# Patient Record
Sex: Male | Born: 1950
Health system: Southern US, Community
[De-identification: ages and names within clinical notes are randomized; demographics above are authoritative.]

## PROBLEM LIST (undated history)

## (undated) DIAGNOSIS — C801 Malignant (primary) neoplasm, unspecified: Secondary | ICD-10-CM

## (undated) HISTORY — PX: TONSILLECTOMY: SUR1361

## (undated) HISTORY — PX: PROSTATECTOMY: SHX69

## (undated) HISTORY — PX: KNEE SURGERY: SHX244

---
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
## 1996-06-08 DIAGNOSIS — C801 Malignant (primary) neoplasm, unspecified: Secondary | ICD-10-CM

## 1996-06-08 HISTORY — DX: Malignant (primary) neoplasm, unspecified: C80.1

## 1999-07-07 ENCOUNTER — Encounter: Admission: RE | Admit: 1999-07-07 | Discharge: 1999-07-07 | Payer: Self-pay | Admitting: Internal Medicine

## 1999-07-07 ENCOUNTER — Encounter: Payer: Self-pay | Admitting: Internal Medicine

## 2000-03-12 ENCOUNTER — Ambulatory Visit (HOSPITAL_BASED_OUTPATIENT_CLINIC_OR_DEPARTMENT_OTHER): Admission: RE | Admit: 2000-03-12 | Discharge: 2000-03-12 | Payer: Self-pay | Admitting: Otolaryngology

## 2001-09-09 ENCOUNTER — Encounter (INDEPENDENT_AMBULATORY_CARE_PROVIDER_SITE_OTHER): Payer: Self-pay | Admitting: Specialist

## 2001-09-09 ENCOUNTER — Ambulatory Visit (HOSPITAL_COMMUNITY): Admission: RE | Admit: 2001-09-09 | Discharge: 2001-09-09 | Payer: Self-pay | Admitting: Gastroenterology

## 2004-08-22 ENCOUNTER — Encounter: Admission: RE | Admit: 2004-08-22 | Discharge: 2004-08-22 | Payer: Self-pay | Admitting: Internal Medicine

## 2007-02-22 ENCOUNTER — Encounter: Admission: RE | Admit: 2007-02-22 | Discharge: 2007-02-22 | Payer: Self-pay | Admitting: Internal Medicine

## 2010-11-10 ENCOUNTER — Other Ambulatory Visit: Payer: Self-pay | Admitting: Gastroenterology

## 2015-08-30 DIAGNOSIS — L718 Other rosacea: Secondary | ICD-10-CM | POA: Diagnosis not present

## 2015-08-30 DIAGNOSIS — D1801 Hemangioma of skin and subcutaneous tissue: Secondary | ICD-10-CM | POA: Diagnosis not present

## 2015-08-30 DIAGNOSIS — D485 Neoplasm of uncertain behavior of skin: Secondary | ICD-10-CM | POA: Diagnosis not present

## 2015-08-30 DIAGNOSIS — L300 Nummular dermatitis: Secondary | ICD-10-CM | POA: Diagnosis not present

## 2015-08-30 DIAGNOSIS — L218 Other seborrheic dermatitis: Secondary | ICD-10-CM | POA: Diagnosis not present

## 2015-08-30 DIAGNOSIS — L570 Actinic keratosis: Secondary | ICD-10-CM | POA: Diagnosis not present

## 2015-09-06 DIAGNOSIS — G4733 Obstructive sleep apnea (adult) (pediatric): Secondary | ICD-10-CM | POA: Diagnosis not present

## 2015-10-07 DIAGNOSIS — G4733 Obstructive sleep apnea (adult) (pediatric): Secondary | ICD-10-CM | POA: Diagnosis not present

## 2015-11-01 DIAGNOSIS — M6530 Trigger finger, unspecified finger: Secondary | ICD-10-CM | POA: Diagnosis not present

## 2015-11-01 DIAGNOSIS — Z1389 Encounter for screening for other disorder: Secondary | ICD-10-CM | POA: Diagnosis not present

## 2015-11-01 DIAGNOSIS — M109 Gout, unspecified: Secondary | ICD-10-CM | POA: Diagnosis not present

## 2015-11-01 DIAGNOSIS — Z0000 Encounter for general adult medical examination without abnormal findings: Secondary | ICD-10-CM | POA: Diagnosis not present

## 2015-11-01 DIAGNOSIS — E7800 Pure hypercholesterolemia, unspecified: Secondary | ICD-10-CM | POA: Diagnosis not present

## 2015-11-01 DIAGNOSIS — Z23 Encounter for immunization: Secondary | ICD-10-CM | POA: Diagnosis not present

## 2015-11-01 DIAGNOSIS — M7130 Other bursal cyst, unspecified site: Secondary | ICD-10-CM | POA: Diagnosis not present

## 2015-11-01 DIAGNOSIS — Z125 Encounter for screening for malignant neoplasm of prostate: Secondary | ICD-10-CM | POA: Diagnosis not present

## 2015-11-01 DIAGNOSIS — Z79899 Other long term (current) drug therapy: Secondary | ICD-10-CM | POA: Diagnosis not present

## 2015-11-01 DIAGNOSIS — G4733 Obstructive sleep apnea (adult) (pediatric): Secondary | ICD-10-CM | POA: Diagnosis not present

## 2015-11-01 DIAGNOSIS — N183 Chronic kidney disease, stage 3 (moderate): Secondary | ICD-10-CM | POA: Diagnosis not present

## 2015-11-07 DIAGNOSIS — G4733 Obstructive sleep apnea (adult) (pediatric): Secondary | ICD-10-CM | POA: Diagnosis not present

## 2015-12-09 DIAGNOSIS — L570 Actinic keratosis: Secondary | ICD-10-CM | POA: Diagnosis not present

## 2015-12-09 DIAGNOSIS — B009 Herpesviral infection, unspecified: Secondary | ICD-10-CM | POA: Diagnosis not present

## 2015-12-09 DIAGNOSIS — G4733 Obstructive sleep apnea (adult) (pediatric): Secondary | ICD-10-CM | POA: Diagnosis not present

## 2015-12-09 DIAGNOSIS — L259 Unspecified contact dermatitis, unspecified cause: Secondary | ICD-10-CM | POA: Diagnosis not present

## 2015-12-11 ENCOUNTER — Other Ambulatory Visit: Payer: Self-pay | Admitting: Nurse Practitioner

## 2015-12-11 ENCOUNTER — Ambulatory Visit
Admission: RE | Admit: 2015-12-11 | Discharge: 2015-12-11 | Disposition: A | Discharge status: Home / Self Care | Payer: PPO | Source: Ambulatory Visit | Attending: Nurse Practitioner | Admitting: Nurse Practitioner

## 2015-12-11 DIAGNOSIS — M25561 Pain in right knee: Secondary | ICD-10-CM | POA: Diagnosis not present

## 2015-12-11 DIAGNOSIS — M179 Osteoarthritis of knee, unspecified: Secondary | ICD-10-CM | POA: Diagnosis not present

## 2015-12-11 DIAGNOSIS — M25461 Effusion, right knee: Secondary | ICD-10-CM | POA: Diagnosis not present

## 2015-12-16 DIAGNOSIS — M1711 Unilateral primary osteoarthritis, right knee: Secondary | ICD-10-CM | POA: Diagnosis not present

## 2016-01-09 DIAGNOSIS — G4733 Obstructive sleep apnea (adult) (pediatric): Secondary | ICD-10-CM | POA: Diagnosis not present

## 2016-02-11 DIAGNOSIS — G4733 Obstructive sleep apnea (adult) (pediatric): Secondary | ICD-10-CM | POA: Diagnosis not present

## 2016-02-25 DIAGNOSIS — S0031XA Abrasion of nose, initial encounter: Secondary | ICD-10-CM | POA: Diagnosis not present

## 2016-02-25 DIAGNOSIS — S0081XA Abrasion of other part of head, initial encounter: Secondary | ICD-10-CM | POA: Diagnosis not present

## 2016-03-06 DIAGNOSIS — L259 Unspecified contact dermatitis, unspecified cause: Secondary | ICD-10-CM | POA: Diagnosis not present

## 2016-03-17 DIAGNOSIS — G4733 Obstructive sleep apnea (adult) (pediatric): Secondary | ICD-10-CM | POA: Diagnosis not present

## 2016-04-09 DIAGNOSIS — M109 Gout, unspecified: Secondary | ICD-10-CM | POA: Diagnosis not present

## 2016-04-09 DIAGNOSIS — Z23 Encounter for immunization: Secondary | ICD-10-CM | POA: Diagnosis not present

## 2016-04-17 DIAGNOSIS — G4733 Obstructive sleep apnea (adult) (pediatric): Secondary | ICD-10-CM | POA: Diagnosis not present

## 2016-05-08 DIAGNOSIS — M109 Gout, unspecified: Secondary | ICD-10-CM | POA: Diagnosis not present

## 2016-05-20 DIAGNOSIS — G4733 Obstructive sleep apnea (adult) (pediatric): Secondary | ICD-10-CM | POA: Diagnosis not present

## 2016-06-22 DIAGNOSIS — G4733 Obstructive sleep apnea (adult) (pediatric): Secondary | ICD-10-CM | POA: Diagnosis not present

## 2016-07-23 DIAGNOSIS — G4733 Obstructive sleep apnea (adult) (pediatric): Secondary | ICD-10-CM | POA: Diagnosis not present

## 2016-07-30 DIAGNOSIS — M25572 Pain in left ankle and joints of left foot: Secondary | ICD-10-CM | POA: Diagnosis not present

## 2016-08-20 IMAGING — CR DG KNEE COMPLETE 4+V*R*
1 series · 1 of 1 positions shown · non-contrast
Comparison: None.

CLINICAL DATA: Right posterior knee pain and swelling for 2-3
weeks. No known injury.

EXAM:
RIGHT KNEE - COMPLETE 4+ VIEW

[view not recorded]
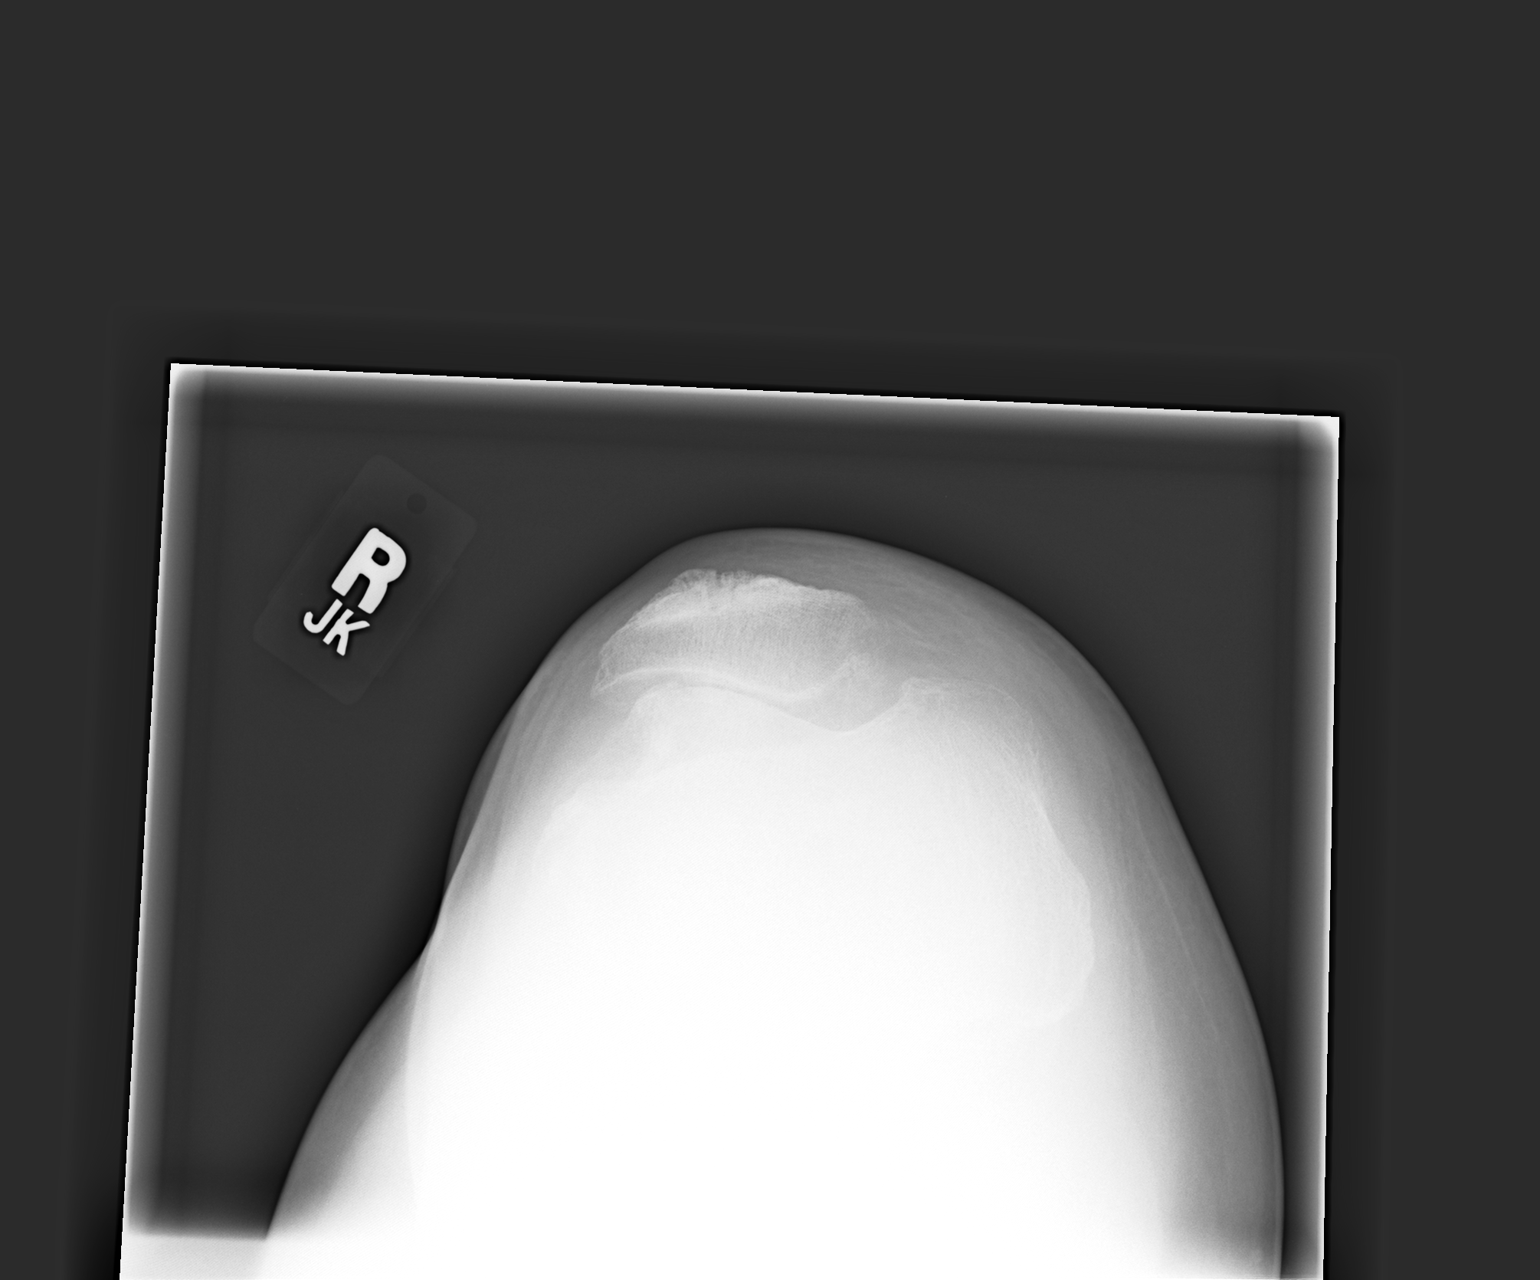

[1 of 1 positions shown; findings below may reference images not displayed]

FINDINGS: Moderate degenerative changes in the right knee with joint space
narrowing and spurring, most pronounced in the patellofemoral
compartment. Small joint effusion. Rounded calcified structure noted
posterior to the right knee which may reflect intra-articular loose
body. Vascular calcifications noted. No fracture, subluxation or
dislocation.
IMPRESSION: Moderate degenerative changes. No acute findings. Small joint
effusion.

## 2016-11-13 DIAGNOSIS — L718 Other rosacea: Secondary | ICD-10-CM | POA: Diagnosis not present

## 2016-11-13 DIAGNOSIS — L570 Actinic keratosis: Secondary | ICD-10-CM | POA: Diagnosis not present

## 2016-11-13 DIAGNOSIS — D225 Melanocytic nevi of trunk: Secondary | ICD-10-CM | POA: Diagnosis not present

## 2016-11-17 DIAGNOSIS — E669 Obesity, unspecified: Secondary | ICD-10-CM | POA: Diagnosis not present

## 2016-11-17 DIAGNOSIS — Z8546 Personal history of malignant neoplasm of prostate: Secondary | ICD-10-CM | POA: Diagnosis not present

## 2016-11-17 DIAGNOSIS — D369 Benign neoplasm, unspecified site: Secondary | ICD-10-CM | POA: Diagnosis not present

## 2016-11-17 DIAGNOSIS — Z79899 Other long term (current) drug therapy: Secondary | ICD-10-CM | POA: Diagnosis not present

## 2016-11-17 DIAGNOSIS — G4733 Obstructive sleep apnea (adult) (pediatric): Secondary | ICD-10-CM | POA: Diagnosis not present

## 2016-11-17 DIAGNOSIS — E7800 Pure hypercholesterolemia, unspecified: Secondary | ICD-10-CM | POA: Diagnosis not present

## 2016-11-17 DIAGNOSIS — M109 Gout, unspecified: Secondary | ICD-10-CM | POA: Diagnosis not present

## 2016-11-17 DIAGNOSIS — N183 Chronic kidney disease, stage 3 (moderate): Secondary | ICD-10-CM | POA: Diagnosis not present

## 2016-11-17 DIAGNOSIS — Z125 Encounter for screening for malignant neoplasm of prostate: Secondary | ICD-10-CM | POA: Diagnosis not present

## 2016-11-17 DIAGNOSIS — J309 Allergic rhinitis, unspecified: Secondary | ICD-10-CM | POA: Diagnosis not present

## 2016-11-17 DIAGNOSIS — Z0000 Encounter for general adult medical examination without abnormal findings: Secondary | ICD-10-CM | POA: Diagnosis not present

## 2016-11-17 DIAGNOSIS — Z1389 Encounter for screening for other disorder: Secondary | ICD-10-CM | POA: Diagnosis not present

## 2016-11-17 DIAGNOSIS — Z6835 Body mass index (BMI) 35.0-35.9, adult: Secondary | ICD-10-CM | POA: Diagnosis not present

## 2016-12-17 DIAGNOSIS — K5730 Diverticulosis of large intestine without perforation or abscess without bleeding: Secondary | ICD-10-CM | POA: Diagnosis not present

## 2016-12-17 DIAGNOSIS — D126 Benign neoplasm of colon, unspecified: Secondary | ICD-10-CM | POA: Diagnosis not present

## 2016-12-17 DIAGNOSIS — K621 Rectal polyp: Secondary | ICD-10-CM | POA: Diagnosis not present

## 2016-12-17 DIAGNOSIS — K648 Other hemorrhoids: Secondary | ICD-10-CM | POA: Diagnosis not present

## 2016-12-21 DIAGNOSIS — D126 Benign neoplasm of colon, unspecified: Secondary | ICD-10-CM | POA: Diagnosis not present

## 2016-12-21 DIAGNOSIS — K621 Rectal polyp: Secondary | ICD-10-CM | POA: Diagnosis not present

## 2017-01-08 DIAGNOSIS — G4733 Obstructive sleep apnea (adult) (pediatric): Secondary | ICD-10-CM | POA: Diagnosis not present

## 2017-03-17 DIAGNOSIS — Z23 Encounter for immunization: Secondary | ICD-10-CM | POA: Diagnosis not present

## 2017-03-17 DIAGNOSIS — I10 Essential (primary) hypertension: Secondary | ICD-10-CM | POA: Diagnosis not present

## 2017-06-18 DIAGNOSIS — I10 Essential (primary) hypertension: Secondary | ICD-10-CM | POA: Diagnosis not present

## 2017-06-18 DIAGNOSIS — E669 Obesity, unspecified: Secondary | ICD-10-CM | POA: Diagnosis not present

## 2017-06-18 DIAGNOSIS — Z6834 Body mass index (BMI) 34.0-34.9, adult: Secondary | ICD-10-CM | POA: Diagnosis not present

## 2017-09-15 DIAGNOSIS — G4733 Obstructive sleep apnea (adult) (pediatric): Secondary | ICD-10-CM | POA: Diagnosis not present

## 2017-09-17 DIAGNOSIS — J309 Allergic rhinitis, unspecified: Secondary | ICD-10-CM | POA: Diagnosis not present

## 2017-09-17 DIAGNOSIS — I10 Essential (primary) hypertension: Secondary | ICD-10-CM | POA: Diagnosis not present

## 2017-10-05 DIAGNOSIS — H0100B Unspecified blepharitis left eye, upper and lower eyelids: Secondary | ICD-10-CM | POA: Diagnosis not present

## 2017-10-08 DIAGNOSIS — G4733 Obstructive sleep apnea (adult) (pediatric): Secondary | ICD-10-CM | POA: Diagnosis not present

## 2017-11-12 DIAGNOSIS — L218 Other seborrheic dermatitis: Secondary | ICD-10-CM | POA: Diagnosis not present

## 2017-11-12 DIAGNOSIS — D485 Neoplasm of uncertain behavior of skin: Secondary | ICD-10-CM | POA: Diagnosis not present

## 2017-11-12 DIAGNOSIS — B351 Tinea unguium: Secondary | ICD-10-CM | POA: Diagnosis not present

## 2017-11-12 DIAGNOSIS — M1712 Unilateral primary osteoarthritis, left knee: Secondary | ICD-10-CM | POA: Diagnosis not present

## 2017-11-19 DIAGNOSIS — M109 Gout, unspecified: Secondary | ICD-10-CM | POA: Diagnosis not present

## 2017-11-19 DIAGNOSIS — Z1389 Encounter for screening for other disorder: Secondary | ICD-10-CM | POA: Diagnosis not present

## 2017-11-19 DIAGNOSIS — E7800 Pure hypercholesterolemia, unspecified: Secondary | ICD-10-CM | POA: Diagnosis not present

## 2017-11-19 DIAGNOSIS — Z125 Encounter for screening for malignant neoplasm of prostate: Secondary | ICD-10-CM | POA: Diagnosis not present

## 2017-11-19 DIAGNOSIS — Z8546 Personal history of malignant neoplasm of prostate: Secondary | ICD-10-CM | POA: Diagnosis not present

## 2017-11-19 DIAGNOSIS — Z79899 Other long term (current) drug therapy: Secondary | ICD-10-CM | POA: Diagnosis not present

## 2017-11-19 DIAGNOSIS — G4733 Obstructive sleep apnea (adult) (pediatric): Secondary | ICD-10-CM | POA: Diagnosis not present

## 2017-11-19 DIAGNOSIS — M25562 Pain in left knee: Secondary | ICD-10-CM | POA: Diagnosis not present

## 2017-11-19 DIAGNOSIS — Z0000 Encounter for general adult medical examination without abnormal findings: Secondary | ICD-10-CM | POA: Diagnosis not present

## 2017-11-19 DIAGNOSIS — I10 Essential (primary) hypertension: Secondary | ICD-10-CM | POA: Diagnosis not present

## 2018-02-01 DIAGNOSIS — D485 Neoplasm of uncertain behavior of skin: Secondary | ICD-10-CM | POA: Diagnosis not present

## 2018-02-01 DIAGNOSIS — L570 Actinic keratosis: Secondary | ICD-10-CM | POA: Diagnosis not present

## 2018-02-01 DIAGNOSIS — L738 Other specified follicular disorders: Secondary | ICD-10-CM | POA: Diagnosis not present

## 2018-03-24 DIAGNOSIS — J45901 Unspecified asthma with (acute) exacerbation: Secondary | ICD-10-CM | POA: Diagnosis not present

## 2018-05-24 DIAGNOSIS — I10 Essential (primary) hypertension: Secondary | ICD-10-CM | POA: Diagnosis not present

## 2018-05-24 DIAGNOSIS — Z6835 Body mass index (BMI) 35.0-35.9, adult: Secondary | ICD-10-CM | POA: Diagnosis not present

## 2018-05-24 DIAGNOSIS — G4733 Obstructive sleep apnea (adult) (pediatric): Secondary | ICD-10-CM | POA: Diagnosis not present

## 2018-05-24 DIAGNOSIS — E7800 Pure hypercholesterolemia, unspecified: Secondary | ICD-10-CM | POA: Diagnosis not present

## 2018-05-24 DIAGNOSIS — Z79899 Other long term (current) drug therapy: Secondary | ICD-10-CM | POA: Diagnosis not present

## 2018-05-25 DIAGNOSIS — G4733 Obstructive sleep apnea (adult) (pediatric): Secondary | ICD-10-CM | POA: Diagnosis not present

## 2018-07-08 DIAGNOSIS — Z8546 Personal history of malignant neoplasm of prostate: Secondary | ICD-10-CM | POA: Diagnosis not present

## 2018-07-08 DIAGNOSIS — I10 Essential (primary) hypertension: Secondary | ICD-10-CM | POA: Diagnosis not present

## 2018-07-28 DIAGNOSIS — I10 Essential (primary) hypertension: Secondary | ICD-10-CM | POA: Diagnosis not present

## 2018-07-28 DIAGNOSIS — Z8546 Personal history of malignant neoplasm of prostate: Secondary | ICD-10-CM | POA: Diagnosis not present

## 2018-09-02 DIAGNOSIS — G4733 Obstructive sleep apnea (adult) (pediatric): Secondary | ICD-10-CM | POA: Diagnosis not present

## 2018-09-14 DIAGNOSIS — G4733 Obstructive sleep apnea (adult) (pediatric): Secondary | ICD-10-CM | POA: Diagnosis not present

## 2018-09-14 DIAGNOSIS — R531 Weakness: Secondary | ICD-10-CM | POA: Diagnosis not present

## 2018-09-14 DIAGNOSIS — E039 Hypothyroidism, unspecified: Secondary | ICD-10-CM | POA: Diagnosis not present

## 2018-10-19 DIAGNOSIS — I10 Essential (primary) hypertension: Secondary | ICD-10-CM | POA: Diagnosis not present

## 2018-10-19 DIAGNOSIS — Z8546 Personal history of malignant neoplasm of prostate: Secondary | ICD-10-CM | POA: Diagnosis not present

## 2018-10-28 DIAGNOSIS — G4733 Obstructive sleep apnea (adult) (pediatric): Secondary | ICD-10-CM | POA: Diagnosis not present

## 2018-11-28 DIAGNOSIS — G4733 Obstructive sleep apnea (adult) (pediatric): Secondary | ICD-10-CM | POA: Diagnosis not present

## 2018-12-05 DIAGNOSIS — Z1389 Encounter for screening for other disorder: Secondary | ICD-10-CM | POA: Diagnosis not present

## 2018-12-05 DIAGNOSIS — Z79899 Other long term (current) drug therapy: Secondary | ICD-10-CM | POA: Diagnosis not present

## 2018-12-05 DIAGNOSIS — I10 Essential (primary) hypertension: Secondary | ICD-10-CM | POA: Diagnosis not present

## 2018-12-05 DIAGNOSIS — G4733 Obstructive sleep apnea (adult) (pediatric): Secondary | ICD-10-CM | POA: Diagnosis not present

## 2018-12-05 DIAGNOSIS — H01009 Unspecified blepharitis unspecified eye, unspecified eyelid: Secondary | ICD-10-CM | POA: Diagnosis not present

## 2018-12-05 DIAGNOSIS — Z0000 Encounter for general adult medical examination without abnormal findings: Secondary | ICD-10-CM | POA: Diagnosis not present

## 2018-12-22 DIAGNOSIS — B0050 Herpesviral ocular disease, unspecified: Secondary | ICD-10-CM | POA: Diagnosis not present

## 2018-12-26 DIAGNOSIS — B0050 Herpesviral ocular disease, unspecified: Secondary | ICD-10-CM | POA: Diagnosis not present

## 2018-12-28 DIAGNOSIS — G4733 Obstructive sleep apnea (adult) (pediatric): Secondary | ICD-10-CM | POA: Diagnosis not present

## 2019-01-04 DIAGNOSIS — Z8546 Personal history of malignant neoplasm of prostate: Secondary | ICD-10-CM | POA: Diagnosis not present

## 2019-01-04 DIAGNOSIS — I10 Essential (primary) hypertension: Secondary | ICD-10-CM | POA: Diagnosis not present

## 2019-01-05 DIAGNOSIS — B0050 Herpesviral ocular disease, unspecified: Secondary | ICD-10-CM | POA: Diagnosis not present

## 2019-01-11 DIAGNOSIS — B0050 Herpesviral ocular disease, unspecified: Secondary | ICD-10-CM | POA: Diagnosis not present

## 2019-01-11 DIAGNOSIS — G4733 Obstructive sleep apnea (adult) (pediatric): Secondary | ICD-10-CM | POA: Diagnosis not present

## 2019-01-11 DIAGNOSIS — H0102A Squamous blepharitis right eye, upper and lower eyelids: Secondary | ICD-10-CM | POA: Diagnosis not present

## 2019-01-11 DIAGNOSIS — H0102B Squamous blepharitis left eye, upper and lower eyelids: Secondary | ICD-10-CM | POA: Diagnosis not present

## 2019-01-12 DIAGNOSIS — I10 Essential (primary) hypertension: Secondary | ICD-10-CM | POA: Diagnosis not present

## 2019-01-12 DIAGNOSIS — Z8546 Personal history of malignant neoplasm of prostate: Secondary | ICD-10-CM | POA: Diagnosis not present

## 2019-01-28 DIAGNOSIS — G4733 Obstructive sleep apnea (adult) (pediatric): Secondary | ICD-10-CM | POA: Diagnosis not present

## 2019-02-08 DIAGNOSIS — R05 Cough: Secondary | ICD-10-CM | POA: Diagnosis not present

## 2019-02-08 DIAGNOSIS — I10 Essential (primary) hypertension: Secondary | ICD-10-CM | POA: Diagnosis not present

## 2019-02-08 DIAGNOSIS — Z0000 Encounter for general adult medical examination without abnormal findings: Secondary | ICD-10-CM | POA: Diagnosis not present

## 2019-02-09 DIAGNOSIS — Z125 Encounter for screening for malignant neoplasm of prostate: Secondary | ICD-10-CM | POA: Diagnosis not present

## 2019-02-09 DIAGNOSIS — I10 Essential (primary) hypertension: Secondary | ICD-10-CM | POA: Diagnosis not present

## 2019-02-09 DIAGNOSIS — B0050 Herpesviral ocular disease, unspecified: Secondary | ICD-10-CM | POA: Diagnosis not present

## 2019-02-09 DIAGNOSIS — H0102A Squamous blepharitis right eye, upper and lower eyelids: Secondary | ICD-10-CM | POA: Diagnosis not present

## 2019-02-09 DIAGNOSIS — H0102B Squamous blepharitis left eye, upper and lower eyelids: Secondary | ICD-10-CM | POA: Diagnosis not present

## 2019-02-12 ENCOUNTER — Other Ambulatory Visit: Payer: Self-pay

## 2019-02-12 ENCOUNTER — Inpatient Hospital Stay (HOSPITAL_COMMUNITY)
Admission: EM | Admit: 2019-02-12 | Discharge: 2019-02-15 | DRG: 176 | Disposition: A | Discharge status: Home / Self Care | Payer: PPO | Attending: Internal Medicine | Admitting: Internal Medicine

## 2019-02-12 ENCOUNTER — Emergency Department (HOSPITAL_COMMUNITY): Payer: PPO

## 2019-02-12 ENCOUNTER — Encounter (HOSPITAL_COMMUNITY): Payer: Self-pay

## 2019-02-12 DIAGNOSIS — I2609 Other pulmonary embolism with acute cor pulmonale: Secondary | ICD-10-CM | POA: Diagnosis not present

## 2019-02-12 DIAGNOSIS — I2602 Saddle embolus of pulmonary artery with acute cor pulmonale: Secondary | ICD-10-CM | POA: Diagnosis not present

## 2019-02-12 DIAGNOSIS — D696 Thrombocytopenia, unspecified: Secondary | ICD-10-CM

## 2019-02-12 DIAGNOSIS — Z803 Family history of malignant neoplasm of breast: Secondary | ICD-10-CM | POA: Diagnosis not present

## 2019-02-12 DIAGNOSIS — I82432 Acute embolism and thrombosis of left popliteal vein: Secondary | ICD-10-CM | POA: Diagnosis not present

## 2019-02-12 DIAGNOSIS — N179 Acute kidney failure, unspecified: Secondary | ICD-10-CM | POA: Diagnosis present

## 2019-02-12 DIAGNOSIS — D7589 Other specified diseases of blood and blood-forming organs: Secondary | ICD-10-CM | POA: Diagnosis present

## 2019-02-12 DIAGNOSIS — Z7951 Long term (current) use of inhaled steroids: Secondary | ICD-10-CM

## 2019-02-12 DIAGNOSIS — N200 Calculus of kidney: Secondary | ICD-10-CM | POA: Diagnosis not present

## 2019-02-12 DIAGNOSIS — Z79899 Other long term (current) drug therapy: Secondary | ICD-10-CM

## 2019-02-12 DIAGNOSIS — Z9079 Acquired absence of other genital organ(s): Secondary | ICD-10-CM

## 2019-02-12 DIAGNOSIS — R7989 Other specified abnormal findings of blood chemistry: Secondary | ICD-10-CM | POA: Diagnosis not present

## 2019-02-12 DIAGNOSIS — M109 Gout, unspecified: Secondary | ICD-10-CM | POA: Diagnosis present

## 2019-02-12 DIAGNOSIS — G4733 Obstructive sleep apnea (adult) (pediatric): Secondary | ICD-10-CM | POA: Diagnosis not present

## 2019-02-12 DIAGNOSIS — R079 Chest pain, unspecified: Secondary | ICD-10-CM | POA: Diagnosis not present

## 2019-02-12 DIAGNOSIS — C787 Secondary malignant neoplasm of liver and intrahepatic bile duct: Secondary | ICD-10-CM | POA: Diagnosis present

## 2019-02-12 DIAGNOSIS — Z8546 Personal history of malignant neoplasm of prostate: Secondary | ICD-10-CM

## 2019-02-12 DIAGNOSIS — I10 Essential (primary) hypertension: Secondary | ICD-10-CM | POA: Diagnosis present

## 2019-02-12 DIAGNOSIS — K769 Liver disease, unspecified: Secondary | ICD-10-CM

## 2019-02-12 DIAGNOSIS — C229 Malignant neoplasm of liver, not specified as primary or secondary: Secondary | ICD-10-CM

## 2019-02-12 DIAGNOSIS — K760 Fatty (change of) liver, not elsewhere classified: Secondary | ICD-10-CM | POA: Diagnosis not present

## 2019-02-12 DIAGNOSIS — I2692 Saddle embolus of pulmonary artery without acute cor pulmonale: Secondary | ICD-10-CM | POA: Diagnosis not present

## 2019-02-12 DIAGNOSIS — D6959 Other secondary thrombocytopenia: Secondary | ICD-10-CM | POA: Diagnosis not present

## 2019-02-12 DIAGNOSIS — R0989 Other specified symptoms and signs involving the circulatory and respiratory systems: Secondary | ICD-10-CM | POA: Diagnosis present

## 2019-02-12 DIAGNOSIS — R0602 Shortness of breath: Secondary | ICD-10-CM | POA: Diagnosis not present

## 2019-02-12 DIAGNOSIS — I340 Nonrheumatic mitral (valve) insufficiency: Secondary | ICD-10-CM | POA: Diagnosis not present

## 2019-02-12 DIAGNOSIS — I2699 Other pulmonary embolism without acute cor pulmonale: Secondary | ICD-10-CM | POA: Diagnosis not present

## 2019-02-12 DIAGNOSIS — R739 Hyperglycemia, unspecified: Secondary | ICD-10-CM | POA: Diagnosis present

## 2019-02-12 DIAGNOSIS — Z20828 Contact with and (suspected) exposure to other viral communicable diseases: Secondary | ICD-10-CM | POA: Diagnosis not present

## 2019-02-12 DIAGNOSIS — R160 Hepatomegaly, not elsewhere classified: Secondary | ICD-10-CM | POA: Diagnosis not present

## 2019-02-12 DIAGNOSIS — C801 Malignant (primary) neoplasm, unspecified: Secondary | ICD-10-CM | POA: Diagnosis not present

## 2019-02-12 LAB — BASIC METABOLIC PANEL
Anion gap: 11 (ref 5–15)
BUN: 16 mg/dL (ref 8–23)
CO2: 23 mmol/L (ref 22–32)
Calcium: 9.9 mg/dL (ref 8.9–10.3)
Chloride: 107 mmol/L (ref 98–111)
Creatinine, Ser: 1.32 mg/dL — ABNORMAL HIGH (ref 0.61–1.24)
GFR calc Af Amer: >60 mL/min (ref >60)
GFR calc non Af Amer: 54 mL/min — ABNORMAL LOW (ref >60)
Glucose, Bld: 124 mg/dL — ABNORMAL HIGH (ref 70–99)
Potassium: 4.2 mmol/L (ref 3.5–5.1)
Sodium: 141 mmol/L (ref 135–145)

## 2019-02-12 LAB — CBC
HCT: 48.3 % (ref 39.0–52.0)
Hemoglobin: 16 g/dL (ref 13.0–17.0)
MCH: 35.1 pg — ABNORMAL HIGH (ref 26.0–34.0)
MCHC: 33.1 g/dL (ref 30.0–36.0)
MCV: 105.9 fL — ABNORMAL HIGH (ref 80.0–100.0)
Platelets: 143 10*3/uL — ABNORMAL LOW (ref 150–400)
RBC: 4.56 MIL/uL (ref 4.22–5.81)
RDW: 13.7 % (ref 11.5–15.5)
WBC: 7.6 10*3/uL (ref 4.0–10.5)
nRBC: 0 % (ref 0.0–0.2)

## 2019-02-12 LAB — BRAIN NATRIURETIC PEPTIDE: B Natriuretic Peptide: 75.1 pg/mL (ref 0.0–100.0)

## 2019-02-12 LAB — TROPONIN I (HIGH SENSITIVITY)
Troponin I (High Sensitivity): 439 ng/L (ref <18)
Troponin I (High Sensitivity): 1007 ng/L (ref <18)
Troponin I (High Sensitivity): 925 ng/L (ref <18)

## 2019-02-12 LAB — SARS CORONAVIRUS 2 BY RT PCR (HOSPITAL ORDER, PERFORMED IN ~~LOC~~ HOSPITAL LAB): SARS Coronavirus 2: NEGATIVE

## 2019-02-12 LAB — RETICULOCYTES
Immature Retic Fract: 12.2 % (ref 2.3–15.9)
RBC.: 4.52 MIL/uL (ref 4.22–5.81)
Retic Count, Absolute: 73.2 10*3/uL (ref 19.0–186.0)
Retic Ct Pct: 1.6 % (ref 0.4–3.1)

## 2019-02-12 LAB — D-DIMER, QUANTITATIVE: D-Dimer, Quant: >20 ug/mL-FEU — ABNORMAL HIGH (ref 0.00–0.50)

## 2019-02-12 LAB — IRON AND TIBC
Iron: 74 ug/dL (ref 45–182)
Saturation Ratios: 23 % (ref 17.9–39.5)
TIBC: 316 ug/dL (ref 250–450)
UIBC: 242 ug/dL

## 2019-02-12 LAB — VITAMIN B12: Vitamin B-12: 617 pg/mL (ref 180–914)

## 2019-02-12 LAB — FERRITIN: Ferritin: 1265 ng/mL — ABNORMAL HIGH (ref 24–336)

## 2019-02-12 LAB — HEPARIN LEVEL (UNFRACTIONATED): Heparin Unfractionated: 0.86 IU/mL — ABNORMAL HIGH (ref 0.30–0.70)

## 2019-02-12 MED ORDER — SODIUM CHLORIDE 0.9% FLUSH
3.0000 mL | Freq: Once | INTRAVENOUS | Status: AC
  Administered 2019-02-12: 3 mL via INTRAVENOUS

## 2019-02-12 MED ORDER — TRAZODONE HCL 50 MG PO TABS
50.0000 mg | ORAL_TABLET | Freq: Once | ORAL | Status: AC
  Administered 2019-02-12: 50 mg via ORAL
  Filled 2019-02-12: qty 1

## 2019-02-12 MED ORDER — LORAZEPAM 2 MG/ML IJ SOLN: 2.0000 mg | INTRAMUSCULAR | Status: DC | PRN

## 2019-02-12 MED ORDER — ALBUTEROL SULFATE (2.5 MG/3ML) 0.083% IN NEBU: 2.5000 mg | INHALATION_SOLUTION | RESPIRATORY_TRACT | Status: DC | PRN

## 2019-02-12 MED ORDER — SENNOSIDES-DOCUSATE SODIUM 8.6-50 MG PO TABS
1.0000 | ORAL_TABLET | Freq: Every evening | ORAL | Status: DC | PRN
  Filled 2019-02-12: qty 1

## 2019-02-12 MED ORDER — BISACODYL 10 MG RE SUPP: 10.0000 mg | Freq: Every day | RECTAL | Status: DC | PRN

## 2019-02-12 MED ORDER — SODIUM CHLORIDE 0.9 % IV SOLN
INTRAVENOUS | Status: DC
  Administered 2019-02-12: 17:00:00 via INTRAVENOUS

## 2019-02-12 MED ORDER — HEPARIN (PORCINE) 25000 UT/250ML-% IV SOLN
1500.0000 [IU]/h | INTRAVENOUS | Status: DC
  Administered 2019-02-12: 1650 [IU]/h via INTRAVENOUS
  Administered 2019-02-13: 19:00:00 1500 [IU]/h via INTRAVENOUS
  Filled 2019-02-12 (×3): qty 250

## 2019-02-12 MED ORDER — SODIUM CHLORIDE (PF) 0.9 % IJ SOLN
INTRAMUSCULAR | Status: AC
  Filled 2019-02-12: qty 50

## 2019-02-12 MED ORDER — DOCUSATE SODIUM 100 MG PO CAPS
100.0000 mg | ORAL_CAPSULE | Freq: Two times a day (BID) | ORAL | Status: DC
  Administered 2019-02-13 – 2019-02-15 (×4): 100 mg via ORAL
  Filled 2019-02-12 (×6): qty 1

## 2019-02-12 MED ORDER — ONDANSETRON HCL 4 MG/2ML IJ SOLN: 4.0000 mg | Freq: Four times a day (QID) | INTRAMUSCULAR | Status: DC | PRN

## 2019-02-12 MED ORDER — ACETAMINOPHEN 650 MG RE SUPP: 650.0000 mg | Freq: Four times a day (QID) | RECTAL | Status: DC | PRN

## 2019-02-12 MED ORDER — HEPARIN BOLUS VIA INFUSION
3000.0000 [IU] | Freq: Once | INTRAVENOUS | Status: AC
  Administered 2019-02-12: 3000 [IU] via INTRAVENOUS
  Filled 2019-02-12: qty 3000

## 2019-02-12 MED ORDER — FOLIC ACID 1 MG PO TABS
1.0000 mg | ORAL_TABLET | Freq: Every day | ORAL | Status: DC
  Administered 2019-02-12 – 2019-02-15 (×4): 1 mg via ORAL
  Filled 2019-02-12 (×4): qty 1

## 2019-02-12 MED ORDER — FLEET ENEMA 7-19 GM/118ML RE ENEM: 1.0000 | ENEMA | Freq: Once | RECTAL | Status: DC | PRN

## 2019-02-12 MED ORDER — ADULT MULTIVITAMIN W/MINERALS CH
1.0000 | ORAL_TABLET | Freq: Every day | ORAL | Status: DC
  Administered 2019-02-12 – 2019-02-15 (×4): 1 via ORAL
  Filled 2019-02-12 (×4): qty 1

## 2019-02-12 MED ORDER — ACETAMINOPHEN 325 MG PO TABS
650.0000 mg | ORAL_TABLET | Freq: Four times a day (QID) | ORAL | Status: DC | PRN
  Administered 2019-02-13 – 2019-02-14 (×2): 650 mg via ORAL
  Filled 2019-02-12 (×2): qty 2

## 2019-02-12 MED ORDER — VITAMIN B-1 100 MG PO TABS
100.0000 mg | ORAL_TABLET | Freq: Every day | ORAL | Status: DC
  Administered 2019-02-12 – 2019-02-15 (×4): 100 mg via ORAL
  Filled 2019-02-12 (×4): qty 1

## 2019-02-12 MED ORDER — IOHEXOL 350 MG/ML SOLN
100.0000 mL | Freq: Once | INTRAVENOUS | Status: AC | PRN
  Administered 2019-02-12: 100 mL via INTRAVENOUS

## 2019-02-12 MED ORDER — ONDANSETRON HCL 4 MG PO TABS: 4.0000 mg | ORAL_TABLET | Freq: Four times a day (QID) | ORAL | Status: DC | PRN

## 2019-02-12 NOTE — Progress Notes (Signed)
ANTICOAGULATION CONSULT NOTE  Pharmacy Consult for heparin Indication: r/o PE  No Known Allergies  Patient Measurements: Height: 6' (182.9 cm) Weight: 242 lb 15.2 oz (110.2 kg) IBW/kg (Calculated) : 77.6 Heparin Dosing Weight: 102.3 kg  Vital Signs: Temp: 99.4 F (37.4 C) (09/06 2018) Temp Source: Oral (09/06 2018) BP: 138/89 (09/06 2018) Pulse Rate: 123 (09/06 2018)  Labs: Recent Labs    02/12/19 1037 02/12/19 1304 02/12/19 1905 02/12/19 2221  HGB 16.0  --   --   --   HCT 48.3  --   --   --   PLT 143*  --   --   --   HEPARINUNFRC  --   --   --  0.86*  CREATININE 1.32*  --   --   --   TROPONINIHS 439* 1,007* 925*  --     Estimated Creatinine Clearance: 66.7 mL/min (A) (by C-G formula based on SCr of 1.32 mg/dL (H)).   Medical History: Past Medical History:  Diagnosis Date  . Cancer [facility]) 1998   prostate    Assessment: 68 yo M to start heparin per pharmacy for r/o PE.  No anticoagulants PTA.   SCr 1.32, PLTC low at 143. D dimer > 2. Trop 439.  Initial heparin level 0.86 units/ml  Goal of Therapy:  Heparin level 0.3-0.7 units/ml Monitor platelets by anticoagulation protocol: Yes   Plan:  Decrease heparin to 1500 units/hr Check heparin level and CBC daily Monitor for bleeding complications  Thanks for allowing pharmacy to be a part of this patient's care.  Excell Seltzer, PharmD Clinical Pharmacist

## 2019-02-12 NOTE — Progress Notes (Signed)
Lab advise Trop 925. Troponin trending down, no action taken. Will continue to monitor,

## 2019-02-12 NOTE — ED Notes (Signed)
Date and time results received: 02/12/19 12:04 PM  (use smartphrase ".now" to insert current time)  Test: Troponin Critical Value: 439  Name of Provider Notified: Geiple  Orders Received? Or Actions Taken?: Actions Taken: Notified Arts development officer and [place], [place]

## 2019-02-12 NOTE — ED Provider Notes (Signed)
[place] DEPT Provider Note   CSN: [id number] Arrival date & time: 02/12/19  [id number]     History   Chief Complaint Chief Complaint  Patient presents with  . Chest Pain    HPI Kenneth Lang is a 68 y.o. male.     Patient with history of prostate cancer presents to the emergency department today with complaint of chest congestion.  Patient states that he has had a congested feeling in his chest for the past several days.  He had a tele-visit with his doctor and prescribed a nasal spray which he feels has helped.  Today he feels some shortness of breath and worsening "congestion" in his chest with activity.  He has had sweating with activity.  He does not have distinct chest pain or pressure.  He feels lightheaded at times but has not had any passing out spells.  No nausea or vomiting.  Tachycardic on arrival.  No history of cardiac or lung disease. Patient denies risk factors for pulmonary embolism including: unilateral leg swelling, history of DVT/PE/other blood clots, use of exogenous hormones, recent immobilizations, recent surgery, recent travel (>4hr segment), hemoptysis.       Past Medical History:  Diagnosis Date  . Cancer [facility]) 1998   prostate    There are no active problems to display for this patient.   History reviewed. No pertinent surgical history.      Home Medications    Prior to Admission medications   Not on File    Family History No family history on file.  Social History Social History   Tobacco Use  . Smoking status: Never Smoker  . Smokeless tobacco: Never Used  Substance Use Topics  . Alcohol use: Yes    Comment: 2-4 night  . Drug use: Never     Allergies   Patient has no known allergies.   Review of Systems Review of Systems  Constitutional: Positive for diaphoresis. Negative for fever.  Eyes: Negative for redness.  Respiratory: Positive for cough, chest tightness and shortness of breath.    Cardiovascular: Negative for chest pain, palpitations and leg swelling.  Gastrointestinal: Negative for abdominal pain, nausea and vomiting.  Genitourinary: Negative for dysuria.  Musculoskeletal: Negative for back pain and neck pain.  Skin: Negative for rash.  Neurological: Positive for light-headedness. Negative for syncope.  Psychiatric/Behavioral: The patient is not nervous/anxious.      Physical Exam Updated Vital Signs BP 134/90 (BP Location: Left Arm)   Pulse (!) 124   Temp 97.8 F (36.6 C) (Oral)   Resp 16   Ht 6' (1.829 m)   Wt 114.8 kg   SpO2 95%   BMI 34.31 kg/m   Physical Exam Vitals signs and nursing note reviewed.  Constitutional:      Appearance: He is well-developed. He is not diaphoretic.  HENT:     Head: Normocephalic and atraumatic.     Mouth/Throat:     Mouth: Mucous membranes are not dry.  Eyes:     Conjunctiva/sclera: Conjunctivae normal.  Neck:     Musculoskeletal: Normal range of motion and neck supple. No muscular tenderness.     Vascular: Normal carotid pulses. No carotid bruit or JVD.     Trachea: Trachea normal. No tracheal deviation.  Cardiovascular:     Rate and Rhythm: Regular rhythm. Tachycardia present.     Pulses: No decreased pulses.     Heart sounds: Normal heart sounds, S1 normal and S2 normal. Heart sounds not distant.  No murmur.  Pulmonary:     Effort: Pulmonary effort is normal. Tachypnea present. No respiratory distress.     Breath sounds: Normal breath sounds. No wheezing.  Chest:     Chest wall: No tenderness.  Abdominal:     General: Bowel sounds are normal.     Palpations: Abdomen is soft.     Tenderness: There is no abdominal tenderness. There is no guarding or rebound.  Musculoskeletal:     Right lower leg: He exhibits no tenderness. No edema.     Left lower leg: He exhibits no tenderness. No edema.     Comments: No clinical signs of DVT.  Skin:    General: Skin is warm and dry.     Coloration: Skin is not pale.   Neurological:     Mental Status: He is alert.      ED Treatments / Results  Labs (all labs ordered are listed, but only abnormal results are displayed) Labs Reviewed  BASIC METABOLIC PANEL - Abnormal; Notable for the following components:      Result Value   Glucose, Bld 124 (*)    Creatinine, Ser 1.32 (*)    GFR calc non Af Amer 54 (*)    All other components within normal limits  CBC - Abnormal; Notable for the following components:   MCV 105.9 (*)    MCH 35.1 (*)    Platelets 143 (*)    All other components within normal limits  D-DIMER, QUANTITATIVE (NOT AT [facility]) - Abnormal; Notable for the following components:   D-Dimer, Quant >20.00 (*)    All other components within normal limits  TROPONIN I (HIGH SENSITIVITY) - Abnormal; Notable for the following components:   Troponin I (High Sensitivity) 439 (*)    All other components within normal limits  TROPONIN I (HIGH SENSITIVITY) - Abnormal; Notable for the following components:   Troponin I (High Sensitivity) 1,007 (*)    All other components within normal limits  SARS CORONAVIRUS 2 (HOSPITAL ORDER, [place] [facility])  HEPARIN LEVEL (UNFRACTIONATED)    EKG EKG Interpretation  Date/Time:  Sunday February 12 2019 10:17:57 EDT Ventricular Rate:  122 PR Interval:    QRS Duration: 96 QT Interval:  301 QTC Calculation: 429 R Axis:   79 Text Interpretation:  Sinus tachycardia Abnormal R-wave progression, early transition Baseline wander in lead(s) I III aVR aVL V3 No old tracing to compare Confirmed by Daleen Bo [phone]) on 02/12/2019 11:20:36 AM   Radiology Dg Chest 2 View  Result Date: 02/12/2019 CLINICAL DATA:  Chest pain, shortness of breath, and fatigue for several days. Diaphoretic. Personal history of prostate carcinoma. EXAM: CHEST - 2 VIEW COMPARISON:  11/30/2008 from [place]: The heart size and mediastinal contours are within normal limits. Both lungs are clear. The  visualized skeletal structures are unremarkable. IMPRESSION: Stable exam.  No active cardiopulmonary disease. Electronically Signed   By: Marlaine Hind M.D.   On: 02/12/2019 11:06    Procedures Procedures (including critical care time)  Medications Ordered in ED Medications  heparin ADULT infusion 100 units/mL (25000 units/241mL sodium chloride 0.45%) (1,650 Units/hr Intravenous New Bag/Given 02/12/19 1249)  sodium chloride (PF) 0.9 % injection (has no administration in time range)  sodium chloride flush (NS) 0.9 % injection 3 mL (3 mLs Intravenous Given 02/12/19 1038)  heparin bolus via infusion 3,000 Units (3,000 Units Intravenous Bolus from Bag 02/12/19 1250)  iohexol (OMNIPAQUE) 350 MG/ML injection 100 mL (100 mLs Intravenous  Contrast Given 02/12/19 1315)     Initial Impression / Assessment and Plan / ED Course  I have reviewed the triage vital signs and the nursing notes.  Pertinent labs & imaging results that were available during my care of the patient were reviewed by me and considered in my medical decision making (see chart for details).        Patient seen and examined.  EKG reviewed showing sinus tachycardia.  Work-up initiated.  Patient appears stable, nontoxic at time of exam.  He speaks in full sentences without any issues.  Will need ACS, PE work-up.  Vital signs reviewed and are as follows: BP 134/90 (BP Location: Left Arm)   Pulse (!) 124   Temp 97.8 F (36.6 C) (Oral)   Resp 16   Ht 6' (1.829 m)   Wt 114.8 kg   SpO2 95%   BMI 34.31 kg/m   10:55 AM O2 89-92%. Pt denies SOB at rest. X-ray came to get patient.   11:29 AM CXR reviewed. Bedside US - cardiac function appears adequate, I do not see a large effusion, ? R-sided dilation. Awaiting d-dimer, troponin.     EMERGENCY DEPARTMENT [place] CARDIAC EXAM "Study: Limited Ultrasound of the Heart and Pericardium"  INDICATIONS:Abnormal vital signs and Dyspnea Multiple views of the heart and pericardium were obtained in  real-time with a multi-frequency probe.  PERFORMED [id number] IMAGES ARCHIVED?: Yes LIMITATIONS:  Body habitus VIEWS USED: Subcostal 4 chamber, Parasternal long axis, Parasternal short axis and Apical 4 chamber  INTERPRETATION: Cardiac activity present, Pericardial effusioin absent, Cardiac tamponade absent, Normal contractility and right ventricle dilated.    12:07 PM d-dimer greater than 20, troponin greater than 400.  Heparin ordered.  Patient discussed with Dr. Eulis Foster who will see.  I rechecked the patient twice.  He is comfortable while lying in the room, no chest pain.  12:42 PM patient rechecked.  He is on 2 L nasal cannula and satting 96 to 97%.  He is comfortable.  He is asking for something to drink.  I informed him we need to get the scan back first.  He understands.  1:21 PM Reviewed imaging in CT. + PE with R heart strain as expected.  1:36 PM patient stable.  Wife now at bedside.  Reviewed imaging results.   2:45 PM Discussed results earlier with CCM on call at [facility].  No emergent indication for treatment, advised that we inform interventional at some point during hospitalization.  I spoke with Dr. Cyndia Skeeters with [facility].  He asked that I speak with interventional radiology.  I had discussion with Dr. Pascal Lux.  States that patient would be candidate given troponin elevation and heart strain given amount of clot, however may be a poor candidate due to risk from suspected metastasis.  He states that we could obtain additional information with CT of the abdomen and pelvis, noncontrast CT of the head.  He states that he will be on-call all night and to call back with further questions.  Dr. Cyndia Skeeters aware.  He will also speak with interventional radiology.  CRITICAL CARE Performed by: Carlisle Cater PA-C  Total critical care time: 50 minutes Critical care time was exclusive of separately billable procedures and treating other patients. Critical care was necessary to treat or prevent  imminent or life-threatening deterioration. Critical care was time spent personally by me on the following activities: development of treatment plan with patient and/or surrogate as well as nursing, discussions with consultants, evaluation of patient's response to treatment,  examination of patient, obtaining history from patient or surrogate, ordering and performing treatments and interventions, ordering and review of laboratory studies, ordering and review of radiographic studies, pulse oximetry and re-evaluation of patient's condition.   Final Clinical Impressions(s) / ED Diagnoses   Final diagnoses:  Other acute pulmonary embolism with acute cor pulmonale ([place])   Admit.   ED Discharge Orders    None       Carlisle Cater, PA-C 02/12/19 1449    Daleen Bo, MD 02/14/19 [phone]

## 2019-02-12 NOTE — Progress Notes (Signed)
ANTICOAGULATION CONSULT NOTE - Initial Consult  Pharmacy Consult for heparin Indication: r/o PE  No Known Allergies  Patient Measurements: Height: 6' (182.9 cm) Weight: 253 lb (114.8 kg) IBW/kg (Calculated) : 77.6 Heparin Dosing Weight: 102.3 kg  Vital Signs: Temp: 97.8 F (36.6 C) (09/06 1016) Temp Source: Oral (09/06 1016) BP: 134/90 (09/06 1016) Pulse Rate: 124 (09/06 1016)  Labs: Recent Labs    02/12/19 1037  HGB 16.0  HCT 48.3  PLT 143*  CREATININE 1.32*  TROPONINIHS 439*    Estimated Creatinine Clearance: 68.1 mL/min (A) (by C-G formula based on SCr of 1.32 mg/dL (H)).   Medical History: Past Medical History:  Diagnosis Date  . Cancer [facility]) 1998   prostate    Assessment: 68 yo M to start heparin per pharmacy for r/o PE.  No anticoagulants PTA.   SCr 1.32, PLTC low at 143. D dimer > 2. Trop 439.   Goal of Therapy:  Heparin level 0.3-0.7 units/ml Monitor platelets by anticoagulation protocol: Yes   Plan:  Give 3000 units bolus x 1 Start heparin infusion at 1650 units/hr Check anti-Xa level in 8 hours and daily while on heparin Continue to monitor H&H and platelets  Eudelia Bunch, Pharm.D [phone] 02/12/2019 12:12 PM

## 2019-02-12 NOTE — H&P (Signed)
History and Physical    Kenneth Lang [id number] DOB: February 02, 1951 DOA: 02/12/2019  PCP: Lajean Manes, MD Patient coming from: Home  Chief Complaint: Chest congestion  HPI: Kenneth Lang is a 68 y.o. male with history of hypertension, OSA on CPAP, gout and remote history of prostate cancer treated surgically presenting with chest congestion.  Patient reports chest congestion for about 6 days.  He also endorses chest pain, shortness of breath, dyspnea on exertion, cough and lightheadedness.  He describes the chest pain as aching.  Chest pain is substernal.  Denies radiation to his jaw or his arms.  Pain is not necessarily exertional but improved with rest.  Pain severity was 5/10 over the last few days and gotten worse to 9/10 today.  He is currently chest pain-free.  He also endorses dyspnea on exertion.  Had associated cough with whitish phlegm in the mornings hours.  He denies hemoptysis.  Also reports lightheadedness.  He denies runny nose, sore throat, fever, chills, nausea, vomiting, abdominal pain or urinary symptoms.  He drove to [facility] about a month ago.  The trip was about 3.5 hours each way.  He denies leg swelling, calf tenderness, personal or family history of blood clot.  He says he had colonoscopy about 5 years ago that revealed polyps.  He was recommended follow-up colonoscopy in 5 years which would be now.  He denies unintentional weight loss, appetite change or excessive night sweats.  He denies personal history of cancer other than remote prostate cancer that was treated surgically.  Denies history of stroke, heart attack, heart failure, diabetes or kidney disease.  He reports family history of breast cancer in his younger sister in her 48s.   Lives with significant other.  Denies smoking cigarettes.  Admits to drinking 2-4 vodka a night.  Has not had alcohol withdrawal issues.  Denies recreational drug use.   In ED, tachycardic to 124.  Heart rate in  upper 20s.  Saturating in mid 90s on room air.  Initial BP 130/90 but trended down to 110's/70s.  Creatinine 1.32.  MCV 106.  Platelet 143.  Otherwise, BMP and CBC not impressive.  High-sensitivity troponin IV 40 and trended up to 1007.  EKG sinus tachycardia without acute ischemic changes.  D-dimer greater than 20.  COVID-19 negative.  CXR without acute finding.  CTA chest revealed acute PE with right heart strain (RV/LV ratio of 2.28) and incidental finding of probable hepatic metastatic disease.  Per EDP, CCM consulted and recommended consulting IR.  I requested EDP to consult IR but no clear recommendation.  I have personally talked to Dr. Pascal Lux to clarify disposition.  Although patient might not be a candidate for catheter directed thrombolysis due to possible malignancy, he suggested admitting to [facility] in case patient decompensates.  He also recommended CT head and abdomen, and involving CCM.  I personally discussed with Dr. Elsworth Soho who suggested admission to stepdown unit under [facility] care.  ROS All review of system negative except for pertinent positives and negatives as history of present illness above.  PMH Past Medical History:  Diagnosis Date   Cancer ([place]) 1998   prostate   [place] History reviewed. No pertinent surgical history. Fam HX No family history on file. No family history of blood clot.  Breast cancer in sister in her 27s.  Social Hx  reports that he has never smoked. He has never used smokeless tobacco. He reports current alcohol use. He reports that he does not  use drugs.  Allergy No Known Allergies Home Meds Prior to Admission medications   Medication Sig Start Date End Date Taking? Authorizing Provider  allopurinol (ZYLOPRIM) 300 MG tablet Take 300 mg by mouth daily. 01/19/19  Yes [provider]  amLODipine (NORVASC) 2.5 MG tablet Take 2.5 mg by mouth daily. 01/24/19  Yes [provider]  cetirizine-pseudoephedrine (ZYRTEC-D) 5-120 MG tablet Take 1  tablet by mouth 2 (two) times daily.   Yes [provider]  Cholecalciferol (VITAMIN D3 PO) Take by mouth.   Yes [provider]  fluticasone (FLONASE) 50 MCG/ACT nasal spray Place 2 sprays into both nostrils daily.   Yes [provider]  lisinopril (ZESTRIL) 10 MG tablet Take 10 mg by mouth daily. 01/23/19  Yes [provider]  Multiple Vitamin (MULTIVITAMIN WITH MINERALS) TABS tablet Take 1 tablet by mouth daily.   Yes [provider]  Omega-3 Fatty Acids (FISH OIL ADULT GUMMIES PO) Take by mouth.   Yes [provider]    Physical Exam: Vitals:   02/12/19 1014 02/12/19 1016 02/12/19 1300  BP:  134/90 (!) 127/96  Pulse:  (!) 124 (!) 122  Resp:  16 (!) 29  Temp:  97.8 F (36.6 C)   TempSrc:  Oral   SpO2:  95% 97%  Weight: 114.8 kg    Height: 6' (1.829 m)      GENERAL: No acute distress.  Appears well.  HEENT: MMM.  Vision and hearing grossly intact.  NECK: Supple.  JVD to his jaw. RESP:  No IWOB. Good air movement bilaterally. CVS: Tachycardic.  Regular rhythm..  Distant heart sounds.  JVD to his jaw. ABD/GI/GU: Bowel sounds present. Soft. Non tender.  MSK/EXT:  Moves extremities. No apparent deformity or edema. No calf tenderness. SKIN: no apparent skin lesion or wound NEURO: Awake, alert and oriented appropriately.  No gross deficit.  PSYCH: Calm. Normal affect.   Personally Reviewed Radiological Exams Dg Chest 2 View  Result Date: 02/12/2019 CLINICAL DATA:  Chest pain, shortness of breath, and fatigue for several days. Diaphoretic. Personal history of prostate carcinoma. EXAM: CHEST - 2 VIEW COMPARISON:  11/30/2008 from [place]: The heart size and mediastinal contours are within normal limits. Both lungs are clear. The visualized skeletal structures are unremarkable. IMPRESSION: Stable exam.  No active cardiopulmonary disease. Electronically Signed   By: Marlaine Hind M.D.   On: 02/12/2019 11:06   Ct Angio  Chest Pe W And/or Wo Contrast  Result Date: 02/12/2019 CLINICAL DATA:  Chest pain since this morning, congestion for 3 days, diaphoresis, suspected pulmonary embolism high pretest probability; history prostate cancer EXAM: CT ANGIOGRAPHY CHEST WITH CONTRAST TECHNIQUE: Multidetector CT imaging of the chest was performed using the standard protocol during bolus administration of intravenous contrast. Multiplanar CT image reconstructions and MIPs were obtained to evaluate the vascular anatomy. CONTRAST:  138mL OMNIPAQUE IOHEXOL 350 MG/ML SOLN IV COMPARISON:  None FINDINGS: Cardiovascular: Atherosclerotic calcifications aorta and coronary arteries. Aorta normal caliber without aneurysm or dissection. Pulmonary arteries well opacified. Multiple pulmonary emboli are present. Saddle emboli are identified at the bifurcations of the RIGHT and LEFT pulmonary arteries extending into multiple lobes. Additional smaller emboli are seen in RIGHT middle and RIGHT upper lobes as well as LEFT upper lobe. Dilatation of RIGHT ventricle and RIGHT atrium. RV/LV ratio = 2.28, significantly increased. Mediastinum/Nodes: Esophagus unremarkable. Base of cervical region normal appearance. Few scattered normal sized mediastinal lymph nodes. No thoracic adenopathy. Lungs/Pleura: Subsegmental atelectasis BILATERAL lower lobes. No  infiltrate, pleural effusion or pneumothorax. Upper Abdomen: Poorly defined low-attenuation foci in liver suspicious for metastatic disease. Remaining visualized upper abdomen unremarkable. Musculoskeletal: Degenerative disc disease changes thoracic spine. No definite osseous metastatic lesions. Review of the MIP images confirms the above findings. IMPRESSION: Extensive BILATERAL pulmonary emboli including saddle emboli the bifurcations of the RIGHT and LEFT pulmonary arteries. Significant dilatation of the RIGHT atrium and RIGHT ventricle. Positive for acute PE with CT evidence of right heart strain (RV/LV Ratio =  2.28) consistent with at least submassive (intermediate risk) PE. The presence of right heart strain has been associated with an increased risk of morbidity and mortality. Please activate Code PE by paging [phone]. Bibasilar atelectasis. Probable hepatic metastatic disease. Aortic Atherosclerosis (ICD10-I70.0). Critical Value/emergent results were called by telephone at the time of interpretation on 02/12/2019 at 1340 hrs to Dr. Dorie Rank, who verbally acknowledged these results. Electronically Signed   By: Lavonia Dana M.D.   On: 02/12/2019 13:41     Personally Reviewed Labs: CBC: Recent Labs  Lab 02/12/19 1037  WBC 7.6  HGB 16.0  HCT 48.3  MCV 105.9*  PLT [id number]*   Basic Metabolic Panel: Recent Labs  Lab 02/12/19 1037  NA 141  K 4.2  CL 107  CO2 23  GLUCOSE 124*  BUN 16  CREATININE 1.32*  CALCIUM 9.9   GFR: Estimated Creatinine Clearance: 68.1 mL/min (A) (by C-G formula based on SCr of 1.32 mg/dL (H)). Liver Function Tests: No results for input(s): AST, ALT, ALKPHOS, BILITOT, PROT, ALBUMIN in the last 168 hours. No results for input(s): LIPASE, AMYLASE in the last 168 hours. No results for input(s): AMMONIA in the last 168 hours. Coagulation Profile: No results for input(s): INR, PROTIME in the last 168 hours. Cardiac Enzymes: No results for input(s): CKTOTAL, CKMB, CKMBINDEX, TROPONINI in the last 168 hours. BNP (last 3 results) No results for input(s): PROBNP in the last 8760 hours. HbA1C: No results for input(s): HGBA1C in the last 72 hours. CBG: No results for input(s): GLUCAP in the last 168 hours. Lipid Profile: No results for input(s): CHOL, HDL, LDLCALC, TRIG, CHOLHDL, LDLDIRECT in the last 72 hours. Thyroid Function Tests: No results for input(s): TSH, T4TOTAL, FREET4, T3FREE, THYROIDAB in the last 72 hours. Anemia Panel: No results for input(s): VITAMINB12, FOLATE, FERRITIN, TIBC, IRON, RETICCTPCT in the last 72 hours. Urine analysis: No results found  for: COLORURINE, APPEARANCEUR, LABSPEC, PHURINE, GLUCOSEU, HGBUR, BILIRUBINUR, KETONESUR, PROTEINUR, UROBILINOGEN, NITRITE, LEUKOCYTESUR  Sepsis Labs:  None  Personally Reviewed EKG:  Sinus tachycardia without acute ischemic finding.  Assessment/Plan Acute pulmonary embolism with significant right heart strain (RV/LV ratio 2.28): Risk factors include recent travel about a months ago and possible malignancy. -Continue IV heparin. -Discussed with IR, Dr. Pascal Lux and PCCM Dr. Elsworth Soho.  -Admit to [facility], stepdown unit.  Probable hepatic metastatic disease: patient has no constitutional symptoms.  Remote history of prostate cancer that was treated surgically.  Sister with breast cancer in her 39s. -We will get CT head/abdomen/pelvis  Hypertension: Normotensive -Closely monitor BP -Hold BP medications -IV normal saline at 100 cc/h  OSA on CPAP -Nightly CPAP  Alcohol use: Admits to drinking 2-4 vodkas a night -CIWA monitoring with Ativan. -Vitamins  Macrocytosis/thrombocytopenia likely due to alcohol. -We will check anemia panel  Elevated creatinine: Unclear baseline.  Likely CKD 3 versus AKI.  BUN within normal range. -Continue monitoring  Hyperglycemia -Check A1c  DVT prophylaxis: On heparin drip  Code Status: Full code Family Communication: Significant other at  bedside  Disposition Plan: Admit to progressive care at [place] called: IR and PCCM Admission status: Inpatient   Mercy Riding MD [facility]  If 7PM-7AM, please contact night-coverage [email or web address] Password [email or web address]  02/12/2019, 2:49 PM

## 2019-02-12 NOTE — ED Provider Notes (Signed)
  Face-to-face evaluation   History: Patient here for evaluation of chest discomfort, associated with ongoing cough for several days productive of sputum.  He denies sneezing, sore throat, ear pain or drainage.  No recent travel.  No recent swelling of lower legs.  He feels generally weak.  Physical exam: Alert, calm, active.  Lungs clear anterior and posterior.  Heart tachycardic without murmur.  Legs no edema or tenderness.  Medical screening examination/treatment/procedure(s) were conducted as a shared visit with non-physician practitioner(s) and myself.  I personally evaluated the patient during the encounter    Daleen Bo, MD 02/14/19 [phone]

## 2019-02-12 NOTE — ED Triage Notes (Signed)
Pt c/o chest pain since this morning. Pt has had congestion the last 3 days, but is feeling worse today. Pt is diaphoretic in triage.

## 2019-02-13 ENCOUNTER — Inpatient Hospital Stay (HOSPITAL_COMMUNITY): Payer: PPO

## 2019-02-13 DIAGNOSIS — I340 Nonrheumatic mitral (valve) insufficiency: Secondary | ICD-10-CM

## 2019-02-13 DIAGNOSIS — I2699 Other pulmonary embolism without acute cor pulmonale: Secondary | ICD-10-CM

## 2019-02-13 LAB — BASIC METABOLIC PANEL
Anion gap: 13 (ref 5–15)
BUN: 16 mg/dL (ref 8–23)
CO2: 20 mmol/L — ABNORMAL LOW (ref 22–32)
Calcium: 9.4 mg/dL (ref 8.9–10.3)
Chloride: 106 mmol/L (ref 98–111)
Creatinine, Ser: 1.22 mg/dL (ref 0.61–1.24)
GFR calc Af Amer: >60 mL/min (ref >60)
GFR calc non Af Amer: 60 mL/min — ABNORMAL LOW (ref >60)
Glucose, Bld: 111 mg/dL — ABNORMAL HIGH (ref 70–99)
Potassium: 4.2 mmol/L (ref 3.5–5.1)
Sodium: 139 mmol/L (ref 135–145)

## 2019-02-13 LAB — CBC
HCT: 45.6 % (ref 39.0–52.0)
Hemoglobin: 15.6 g/dL (ref 13.0–17.0)
MCH: 35.5 pg — ABNORMAL HIGH (ref 26.0–34.0)
MCHC: 34.2 g/dL (ref 30.0–36.0)
MCV: 103.6 fL — ABNORMAL HIGH (ref 80.0–100.0)
Platelets: 143 10*3/uL — ABNORMAL LOW (ref 150–400)
RBC: 4.4 MIL/uL (ref 4.22–5.81)
RDW: 13.6 % (ref 11.5–15.5)
WBC: 6.6 10*3/uL (ref 4.0–10.5)
nRBC: 0 % (ref 0.0–0.2)

## 2019-02-13 LAB — ECHOCARDIOGRAM COMPLETE
Height: 72 in
Weight: 3916.8 oz

## 2019-02-13 LAB — FOLATE: Folate: 28.6 ng/mL (ref >5.9)

## 2019-02-13 LAB — HEPARIN LEVEL (UNFRACTIONATED): Heparin Unfractionated: 0.65 IU/mL (ref 0.30–0.70)

## 2019-02-13 MED ORDER — IOHEXOL 300 MG/ML  SOLN
100.0000 mL | Freq: Once | INTRAMUSCULAR | Status: AC | PRN
  Administered 2019-02-13: 100 mL via INTRAVENOUS

## 2019-02-13 NOTE — Progress Notes (Signed)
  Echocardiogram 2D Echocardiogram has been performed.  Kenneth Lang 02/13/2019, 2:44 PM

## 2019-02-13 NOTE — Progress Notes (Signed)
Dr. Tawanna Solo updated via [facility] text message. Pt is positive for Left Popliteal DVT as reported by CV tech [place]. CV tech attempted to text MD  With no response.

## 2019-02-13 NOTE — Progress Notes (Signed)
PROGRESS NOTE    Kenneth Lang  [id number] DOB: 1950/12/27 DOA: 02/12/2019 PCP: Lajean Manes, MD   Brief Narrative:  Patient is 68 year old male with history of hypertension, OSA on CPAP, remote history of prostate cancer which was treated surgically who presented with chest pain.  On presentation, he was found to be tachycardic with d-dimer was greater than 20.    CT chest revealed acute PE with right heart strain.  CT abdomen/pelvis showed hepatic metastasis.  Currently on IV heparin.   Assessment & Plan:   Active Problems:   Pulmonary embolism (HCC)   Acute pulmonary embolism: Presented with chest pain.  CT imaging showed bilateral pulmonary embolism with significant right heart strain.  Elevated d-dimer.  Elevated troponin.  Most likely associated with malignancy.  Continue heparin drip for today.  Will request PT evaluation.  Admitting physician had discussed with IR and PCCM.  IR did not recommend catheter directed thrombolysis due to coexisting malignancy. We will continue heparin drip for today, plan to start on Eliquis after  Biopsy of liver.  Hepatic metastatic disease: CT abdomen/pelvis showed hepatic metastasis.  New finding of primary issues. History of prostate cancer that was treated surgically . He was discussed with Dr. Julien Nordmann, oncology .We will get the biopsy of the liver.Her will follow up with oncology as an outpatient.  Hypertension: Currently normotensive.  Continue gentle IV fluids.  BP medicines on hold.  OSA: Continue CPAP at night.  Alcohol use: Drinks heavily.  Monitor for withdrawal  Macrocytosis/thrombocytopenia: Most likely secondary to chronic alcohol use.  Continue to monitor.  Vitamin [id number], folic acid level normal.  AKI: Much improved.           DVT prophylaxis: Heparin IV Code Status: Full Family Communication: None present at the bedside Disposition Plan: Home after completion of full work-up.   Consultants: None  Procedures:  None  Antimicrobials:  Anti-infectives (From admission, onward)   None      Subjective:  Patient seen and examined at bedside this afternoon.  Comfortable, hemodynamically stable.  Denies any chest pain today.  Denies shortness of breath.  Discussed about findings CT abdomen/pelvis.  Objective: Vitals:   02/12/19 2301 02/13/19 0006 02/13/19 0400 02/13/19 0459  BP:  104/74  106/76  Pulse: (!) 119 (!) 111 (!) 110 (!) 106  Resp:  19  20  Temp:  98.2 F (36.8 C)  98 F (36.7 C)  TempSrc:  Oral  Oral  SpO2: 92% 94% (!) 85% 99%  Weight:    111 kg  Height:        Intake/Output Summary (Last 24 hours) at 02/13/2019 1318 Last data filed at 02/13/2019 1041 Gross per 24 hour  Intake 1889.95 ml  Output 425 ml  Net 1464.95 ml   Filed Weights   02/12/19 1014 02/12/19 1700 02/13/19 0459  Weight: 114.8 kg 110.2 kg 111 kg    Examination:  General exam: Appears calm and comfortable ,Not in distress,average built HEENT:PERRL,Oral mucosa moist, Ear/Nose normal on gross exam Respiratory system: Bilateral equal air entry, normal vesicular breath sounds, no wheezes or crackles  Cardiovascular system: S1 & S2 heard, RRR. No JVD, murmurs, rubs, gallops or clicks. No pedal edema. Gastrointestinal system: Abdomen is nondistended, soft and nontender. No organomegaly or masses felt. Normal bowel sounds heard. Central nervous system: Alert and oriented. No focal neurological deficits. Extremities: No edema, no clubbing ,no cyanosis, distal peripheral pulses palpable. Skin: No rashes, lesions or ulcers,no icterus ,no pallor    Data  Reviewed: I have personally reviewed following labs and imaging studies  CBC: Recent Labs  Lab 02/12/19 1037 02/13/19 0647  WBC 7.6 6.6  HGB 16.0 15.6  HCT 48.3 45.6  MCV 105.9* 103.6*  PLT 143* [id number]*   Basic Metabolic Panel: Recent Labs  Lab 02/12/19 1037 02/13/19 0647  NA 141 139  K 4.2 4.2  CL 107 106  CO2 23 20*  GLUCOSE 124* 111*  BUN 16 16    CREATININE 1.32* 1.22  CALCIUM 9.9 9.4   GFR: Estimated Creatinine Clearance: 72.5 mL/min (by C-G formula based on SCr of 1.22 mg/dL). Liver Function Tests: No results for input(s): AST, ALT, ALKPHOS, BILITOT, PROT, ALBUMIN in the last 168 hours. No results for input(s): LIPASE, AMYLASE in the last 168 hours. No results for input(s): AMMONIA in the last 168 hours. Coagulation Profile: No results for input(s): INR, PROTIME in the last 168 hours. Cardiac Enzymes: No results for input(s): CKTOTAL, CKMB, CKMBINDEX, TROPONINI in the last 168 hours. BNP (last 3 results) No results for input(s): PROBNP in the last 8760 hours. HbA1C: No results for input(s): HGBA1C in the last 72 hours. CBG: No results for input(s): GLUCAP in the last 168 hours. Lipid Profile: No results for input(s): CHOL, HDL, LDLCALC, TRIG, CHOLHDL, LDLDIRECT in the last 72 hours. Thyroid Function Tests: No results for input(s): TSH, T4TOTAL, FREET4, T3FREE, THYROIDAB in the last 72 hours. Anemia Panel: Recent Labs    02/12/19 1727  VITAMINB12 617  FOLATE 28.6  FERRITIN 1,265*  TIBC 316  IRON 74  RETICCTPCT 1.6   Sepsis Labs: No results for input(s): PROCALCITON, LATICACIDVEN in the last 168 hours.  Recent Results (from the past 240 hour(s))  SARS Coronavirus 2 [facility] order, Performed in [facility] hospital lab) Nasopharyngeal Nasopharyngeal Swab     Status: None   Collection Time: 02/12/19  1:23 PM   Specimen: Nasopharyngeal Swab  Result Value Ref Range Status   SARS Coronavirus 2 NEGATIVE NEGATIVE Final    Comment: (NOTE) If result is NEGATIVE SARS-CoV-2 target nucleic acids are NOT DETECTED. The SARS-CoV-2 RNA is generally detectable in upper and lower  respiratory specimens during the acute phase of infection. The lowest  concentration of SARS-CoV-2 viral copies this assay can detect is 250  copies / mL. A negative result does not preclude SARS-CoV-2 infection  and should not be used as the sole  basis for treatment or other  patient management decisions.  A negative result may occur with  improper specimen collection / handling, submission of specimen other  than nasopharyngeal swab, presence of viral mutation(s) within the  areas targeted by this assay, and inadequate number of viral copies  (<250 copies / mL). A negative result must be combined with clinical  observations, patient history, and epidemiological information. If result is POSITIVE SARS-CoV-2 target nucleic acids are DETECTED. The SARS-CoV-2 RNA is generally detectable in upper and lower  respiratory specimens dur ing the acute phase of infection.  Positive  results are indicative of active infection with SARS-CoV-2.  Clinical  correlation with patient history and other diagnostic information is  necessary to determine patient infection status.  Positive results do  not rule out bacterial infection or co-infection with other viruses. If result is PRESUMPTIVE POSTIVE SARS-CoV-2 nucleic acids MAY BE PRESENT.   A presumptive positive result was obtained on the submitted specimen  and confirmed on repeat testing.  While 2019 novel coronavirus  (SARS-CoV-2) nucleic acids may be present in the submitted sample  additional  confirmatory testing may be necessary for epidemiological  and / or clinical management purposes  to differentiate between  SARS-CoV-2 and other Sarbecovirus currently known to infect humans.  If clinically indicated additional testing with an alternate test  methodology [phone]) is advised. The SARS-CoV-2 RNA is generally  detectable in upper and lower respiratory sp ecimens during the acute  phase of infection. The expected result is Negative. Fact Sheet for Patients:  StrictlyIdeas.no Fact Sheet for Healthcare Providers: BankingDealers.co.za This test is not yet approved or cleared by the [place] FDA and has been authorized for detection  and/or diagnosis of SARS-CoV-2 by FDA under an Emergency Use Authorization (EUA).  This EUA will remain in effect (meaning this test can be used) for the duration of the COVID-19 declaration under Section 564(b)(1) of the Act, 21 U.S.C. section 360bbb-3(b)(1), unless the authorization is terminated or revoked sooner. Performed at [facility], [address], [place], [place] [address]          Radiology Studies: Dg Chest 2 View  Result Date: 02/12/2019 CLINICAL DATA:  Chest pain, shortness of breath, and fatigue for several days. Diaphoretic. Personal history of prostate carcinoma. EXAM: CHEST - 2 VIEW COMPARISON:  11/30/2008 from [place]: The heart size and mediastinal contours are within normal limits. Both lungs are clear. The visualized skeletal structures are unremarkable. IMPRESSION: Stable exam.  No active cardiopulmonary disease. Electronically Signed   By: Marlaine Hind M.D.   On: 02/12/2019 11:06   Ct Head W Contrast  Result Date: 02/13/2019 CLINICAL DATA:  Liver metastases.  Initial staging EXAM: CT HEAD WITHOUT AND WITH CONTRAST TECHNIQUE: Contiguous axial images were obtained from the base of the skull through the vertex without and with intravenous contrast CONTRAST:  133mL OMNIPAQUE IOHEXOL 300 MG/ML  SOLN COMPARISON:  None. FINDINGS: Brain: No evidence of acute infarction, hemorrhage, hydrocephalus, extra-axial collection or mass lesion/mass effect. No abnormal enhancement. Vascular: Major vessels are enhancing Skull: Posterior scalp high-density thickening without underlying lesion or fracture. Sinuses/Orbits: Negative IMPRESSION: 1. No acute intracranial finding or evidence of brain metastasis. 2. Posterior scalp thickening/hematoma without calvarial fracture. Electronically Signed   By: Monte Fantasia M.D.   On: 02/13/2019 11:40   Ct Angio Chest Pe W And/or Wo Contrast  Result Date: 02/12/2019 CLINICAL DATA:  Chest pain since this  morning, congestion for 3 days, diaphoresis, suspected pulmonary embolism high pretest probability; history prostate cancer EXAM: CT ANGIOGRAPHY CHEST WITH CONTRAST TECHNIQUE: Multidetector CT imaging of the chest was performed using the standard protocol during bolus administration of intravenous contrast. Multiplanar CT image reconstructions and MIPs were obtained to evaluate the vascular anatomy. CONTRAST:  132mL OMNIPAQUE IOHEXOL 350 MG/ML SOLN IV COMPARISON:  None FINDINGS: Cardiovascular: Atherosclerotic calcifications aorta and coronary arteries. Aorta normal caliber without aneurysm or dissection. Pulmonary arteries well opacified. Multiple pulmonary emboli are present. Saddle emboli are identified at the bifurcations of the RIGHT and LEFT pulmonary arteries extending into multiple lobes. Additional smaller emboli are seen in RIGHT middle and RIGHT upper lobes as well as LEFT upper lobe. Dilatation of RIGHT ventricle and RIGHT atrium. RV/LV ratio = 2.28, significantly increased. Mediastinum/Nodes: Esophagus unremarkable. Base of cervical region normal appearance. Few scattered normal sized mediastinal lymph nodes. No thoracic adenopathy. Lungs/Pleura: Subsegmental atelectasis BILATERAL lower lobes. No infiltrate, pleural effusion or pneumothorax. Upper Abdomen: Poorly defined low-attenuation foci in liver suspicious for metastatic disease. Remaining visualized upper abdomen unremarkable. Musculoskeletal: Degenerative disc disease changes thoracic spine. No definite osseous metastatic lesions.  Review of the MIP images confirms the above findings. IMPRESSION: Extensive BILATERAL pulmonary emboli including saddle emboli the bifurcations of the RIGHT and LEFT pulmonary arteries. Significant dilatation of the RIGHT atrium and RIGHT ventricle. Positive for acute PE with CT evidence of right heart strain (RV/LV Ratio = 2.28) consistent with at least submassive (intermediate risk) PE. The presence of right heart  strain has been associated with an increased risk of morbidity and mortality. Please activate Code PE by paging [phone]. Bibasilar atelectasis. Probable hepatic metastatic disease. Aortic Atherosclerosis (ICD10-I70.0). Critical Value/emergent results were called by telephone at the time of interpretation on 02/12/2019 at 1340 hrs to Dr. Dorie Rank, who verbally acknowledged these results. Electronically Signed   By: Lavonia Dana M.D.   On: 02/12/2019 13:41   Ct Abdomen Pelvis W Contrast  Result Date: 02/13/2019 CLINICAL DATA:  Hepatic masses on recent chest CT. Personal history of prostate carcinoma. EXAM: CT ABDOMEN AND PELVIS WITH CONTRAST TECHNIQUE: Multidetector CT imaging of the abdomen and pelvis was performed using the standard protocol following bolus administration of intravenous contrast. CONTRAST:  161mL OMNIPAQUE IOHEXOL 300 MG/ML  SOLN COMPARISON:  Chest CTA on 02/12/2019 FINDINGS: Lower Chest: Bilateral lower lobe pulmonary emboli, as demonstrated on chest CTA performed yesterday. Bibasilar scarring. Hepatobiliary: Numerous small hypo vascular masses are seen throughout the right and left lobes, consistent with diffuse liver metastases. The largest index lesion near the junction of the right and left lobes measures 3.9 x 2.0 cm on image 26/3. Gallbladder is unremarkable. No evidence of biliary ductal dilatation. No evidence of portal or hepatic vein thrombosis. Pancreas:  No mass or inflammatory changes. Spleen: Within normal limits in size and appearance. Adrenals/Urinary Tract: No masses identified. A few tiny 2-3 mm right renal calculi are identified, however there is no evidence of ureteral calculi or hydronephrosis. Unremarkable unopacified urinary bladder. Stomach/Bowel: No evidence of obstruction, inflammatory process or abnormal fluid collections. Normal appendix visualized. Vascular/Lymphatic: No pathologically enlarged lymph nodes. No abdominal aortic aneurysm. Aortic atherosclerosis.  Reproductive: Postop changes from previous prostatectomy and bilateral iliac lymph node dissection. Other:  None. Musculoskeletal:  No suspicious bone lesions identified. IMPRESSION: 1. Diffuse liver metastases. 2. No other sites of primary or metastatic carcinoma identified within the abdomen or pelvis. 3. Tiny nonobstructing right renal calculi. 4. Previous prostatectomy and bilateral iliac lymph node dissection. Aortic Atherosclerosis (ICD10-I70.0). Electronically Signed   By: Marlaine Hind M.D.   On: 02/13/2019 10:44        Scheduled Meds:  docusate sodium  100 mg Oral BID   folic acid  1 mg Oral Daily   multivitamin with minerals  1 tablet Oral Daily   thiamine  100 mg Oral Daily   Continuous Infusions:  sodium chloride 100 mL/hr at 02/13/19 0400   heparin 1,500 Units/hr (02/12/19 2312)     LOS: 1 day    Time spent: 35 mins.More than 50% of that time was spent in counseling and/or coordination of care.      Shelly Coss, MD [facility] Pager [phone]  If 7PM-7AM, please contact night-coverage [email or web address] Password [email or web address] 02/13/2019, 1:18 PM

## 2019-02-13 NOTE — Progress Notes (Signed)
ANTICOAGULATION CONSULT NOTE  Pharmacy Consult for heparin Indication: r/o PE  No Known Allergies  Patient Measurements: Height: 6' (182.9 cm) Weight: 244 lb 12.8 oz (111 kg) IBW/kg (Calculated) : 77.6 Heparin Dosing Weight: 101 kg  Vital Signs: Temp: 98 F (36.7 C) (09/07 0459) Temp Source: Oral (09/07 0459) BP: 106/76 (09/07 0459) Pulse Rate: 106 (09/07 0459)  Labs: Recent Labs    02/12/19 1037 02/12/19 1304 02/12/19 1905 02/12/19 2221 02/13/19 0647  HGB 16.0  --   --   --  15.6  HCT 48.3  --   --   --  45.6  PLT 143*  --   --   --  143*  HEPARINUNFRC  --   --   --  0.86* 0.65  CREATININE 1.32*  --   --   --  1.22  TROPONINIHS 439* 1,007* 925*  --   --     Estimated Creatinine Clearance: 72.5 mL/min (by C-G formula based on SCr of 1.22 mg/dL).   Medical History: Past Medical History:  Diagnosis Date  . Cancer [facility]) 1998   prostate    Assessment: 68 yo M to start heparin per pharmacy for r/o PE.  No anticoagulants PTA. Troponin's are positive.   Heparin level therapeutic at 0.65 today after dose decrease from 1650 to 1500 units/hr. H&H stable at 15.6/45.6 and plts are wnl at 143.    Goal of Therapy:  Heparin level 0.3-0.7 units/ml Monitor platelets by anticoagulation protocol: Yes    Plan:  Continue heparin IV infusion at 1500 units/hour  Check heparin level and CBC daily Monitor for bleeding s/sx    Thank you,   Eddie Candle, PharmD PGY-1 Pharmacy Resident   Please check amion for clinical pharmacist contact number

## 2019-02-13 NOTE — Progress Notes (Signed)
Bilateral lower extremity venous duplex completed. Preliminary results in Chart review CV Proc. [facility], [place] 02/13/2019, 2:11 PM

## 2019-02-14 ENCOUNTER — Inpatient Hospital Stay (HOSPITAL_COMMUNITY): Payer: PPO

## 2019-02-14 ENCOUNTER — Encounter (HOSPITAL_COMMUNITY): Payer: Self-pay | Admitting: Radiology

## 2019-02-14 DIAGNOSIS — I2692 Saddle embolus of pulmonary artery without acute cor pulmonale: Secondary | ICD-10-CM

## 2019-02-14 LAB — CBC
HCT: 39.7 % (ref 39.0–52.0)
Hemoglobin: 13.6 g/dL (ref 13.0–17.0)
MCH: 35.3 pg — ABNORMAL HIGH (ref 26.0–34.0)
MCHC: 34.3 g/dL (ref 30.0–36.0)
MCV: 103.1 fL — ABNORMAL HIGH (ref 80.0–100.0)
Platelets: 129 10*3/uL — ABNORMAL LOW (ref 150–400)
RBC: 3.85 MIL/uL — ABNORMAL LOW (ref 4.22–5.81)
RDW: 13.6 % (ref 11.5–15.5)
WBC: 5.2 10*3/uL (ref 4.0–10.5)
nRBC: 0 % (ref 0.0–0.2)

## 2019-02-14 LAB — PROTIME-INR
INR: 1.1 (ref 0.8–1.2)
Prothrombin Time: 13.6 seconds (ref 11.4–15.2)

## 2019-02-14 LAB — HEPARIN LEVEL (UNFRACTIONATED): Heparin Unfractionated: 0.46 IU/mL (ref 0.30–0.70)

## 2019-02-14 MED ORDER — LIDOCAINE HCL (PF) 1 % IJ SOLN
INTRAMUSCULAR | Status: AC
  Filled 2019-02-14: qty 30

## 2019-02-14 MED ORDER — APIXABAN 5 MG PO TABS
10.0000 mg | ORAL_TABLET | Freq: Two times a day (BID) | ORAL | Status: DC
  Administered 2019-02-15: 10 mg via ORAL
  Filled 2019-02-14: qty 2

## 2019-02-14 MED ORDER — FENTANYL CITRATE (PF) 100 MCG/2ML IJ SOLN
INTRAMUSCULAR | Status: AC
  Filled 2019-02-14: qty 2

## 2019-02-14 MED ORDER — KETOROLAC TROMETHAMINE 15 MG/ML IJ SOLN
15.0000 mg | Freq: Once | INTRAMUSCULAR | Status: AC
  Administered 2019-02-14: 16:00:00 15 mg via INTRAVENOUS
  Filled 2019-02-14: qty 1

## 2019-02-14 MED ORDER — HEPARIN (PORCINE) 25000 UT/250ML-% IV SOLN
1500.0000 [IU]/h | INTRAVENOUS | Status: AC
  Administered 2019-02-14 – 2019-02-15 (×2): 1500 [IU]/h via INTRAVENOUS
  Filled 2019-02-14: qty 250

## 2019-02-14 MED ORDER — MIDAZOLAM HCL 2 MG/2ML IJ SOLN
INTRAMUSCULAR | Status: AC
  Filled 2019-02-14: qty 2

## 2019-02-14 MED ORDER — APIXABAN 5 MG PO TABS: 5.0000 mg | ORAL_TABLET | Freq: Two times a day (BID) | ORAL | Status: DC

## 2019-02-14 MED ORDER — FENTANYL CITRATE (PF) 100 MCG/2ML IJ SOLN
INTRAMUSCULAR | Status: AC | PRN
  Administered 2019-02-14 (×2): 50 ug via INTRAVENOUS

## 2019-02-14 MED ORDER — MIDAZOLAM HCL 2 MG/2ML IJ SOLN
INTRAMUSCULAR | Status: AC | PRN
  Administered 2019-02-14 (×2): 1 mg via INTRAVENOUS

## 2019-02-14 NOTE — Progress Notes (Signed)
Interventional Radiology Progress Note   Attempt at US guided liver mass biopsy.   [place] was not useful for resolving any of the multiple masses on CT.  Will attempt CT guided biopsy.   Stable to Ct.  Signed,  Dulcy Fanny. Earleen Newport, DO

## 2019-02-14 NOTE — Procedures (Signed)
Interventional Radiology Procedure Note  Procedure: Ct guided biopsy of liver mass.   Complications: None  Recommendations:  - Ok to shower tomorrow - Do not submerge for 7 days - Routine care - follow up pathology   Signed,  Dulcy Fanny. Earleen Newport, DO

## 2019-02-14 NOTE — Progress Notes (Signed)
PROGRESS NOTE    Kenneth Lang  [id number] DOB: Jan 02, 1951 DOA: 02/12/2019 PCP: Lajean Manes, MD   Brief Narrative:  Patient is 68 year old male with history of hypertension, OSA on CPAP, remote history of prostate cancer which was treated surgically who presented with chest pain.  On presentation, he was found to be tachycardic with d-dimer was greater than 20.    CT chest revealed acute PE with right heart strain.  CT abdomen/pelvis showed hepatic metastasis.  Started  on IV heparin.He underwent CT-guided liver biopsy for evaluation of hepatic metastatic lesions.  Plan is to start on Eliquis .   Assessment & Plan:   Active Problems:   Pulmonary embolism (HCC)   Acute pulmonary embolism: Presented with chest pain.  CT imaging showed bilateral pulmonary embolism with significant right heart strain.  Elevated d-dimer and  troponin.   Admitting physician had discussed with IR and PCCM.  IR did not recommend catheter directed thrombolysis due to possible coexisting malignancy. We will continue heparin drip for now, plan to start on Eliquis after  Biopsy of liver. He is saturating fine on room air. Waiting for physical therapy assessment.  Hepatic metastatic disease: CT abdomen/pelvis showed hepatic metastasis.  No finding of primary source as per imaging. History of remote prostate cancer that was treated surgically . Case was  discussed with Dr. Julien Nordmann, oncology .We will get the biopsy of the liver.He will follow up with oncology as an outpatient.I have forwarded all information to Dr Julien Nordmann who will follow up the biopsy report and call for appointment if necessary.  Hypertension: Currently normotensive.    BP medicines on hold.can resume on discharge.  OSA: Continue CPAP at night.  Alcohol use: Reports drinking daily.  Not on withdrawal  Macrocytosis/thrombocytopenia: Most likely secondary to chronic alcohol use.  Continue to monitor.  Vitamin [id number], folic acid level  normal.  AKI: Much improved.           DVT prophylaxis: Heparin IV Code Status: Full Family Communication: Called wife on phone,call not received Disposition Plan: Home tomorrow.Waiting for PT evaluation   Consultants: None  Procedures: None  Antimicrobials:  Anti-infectives (From admission, onward)   None      Subjective:  Patient seen and examined the bedside this morning.  Hemodynamically stable.  Comfortable.  Denies any chest pain or shortness of breath.  Waiting for liver biopsy.  Objective: Vitals:   02/14/19 1315 02/14/19 1320 02/14/19 1325 02/14/19 1330  BP: (!) 146/98 (!) 141/92 (!) 141/96 (!) 142/95  Pulse: 96 93 92 91  Resp: (!) 23 18 19  (!) 21  Temp:      TempSrc:      SpO2: 96% 94% 95% 92%  Weight:      Height:        Intake/Output Summary (Last 24 hours) at 02/14/2019 1351 Last data filed at 02/14/2019 0500 Gross per 24 hour  Intake 2418.38 ml  Output 1975 ml  Net 443.38 ml   Filed Weights   02/12/19 1700 02/13/19 0459 02/14/19 0639  Weight: 110.2 kg 111 kg 111.2 kg    Examination:  General exam: Appears calm and comfortable ,Not in distress,average built HEENT:PERRL,Oral mucosa moist, Ear/Nose normal on gross exam Respiratory system: Bilateral equal air entry, normal vesicular breath sounds, no wheezes or crackles  Cardiovascular system: S1 & S2 heard, RRR. No JVD, murmurs, rubs, gallops or clicks. Gastrointestinal system: Abdomen is nondistended, soft and nontender. No organomegaly or masses felt. Normal bowel sounds heard. Central nervous system:  Alert and oriented. No focal neurological deficits. Extremities: No edema, no clubbing ,no cyanosis, distal peripheral pulses palpable. Skin: No rashes, lesions or ulcers,no icterus ,no pallor    Data Reviewed: I have personally reviewed following labs and imaging studies  CBC: Recent Labs  Lab 02/12/19 1037 02/13/19 0647 02/14/19 0448  WBC 7.6 6.6 5.2  HGB 16.0 15.6 13.6  HCT 48.3  45.6 39.7  MCV 105.9* 103.6* 103.1*  PLT 143* 143* [id number]*   Basic Metabolic Panel: Recent Labs  Lab 02/12/19 1037 02/13/19 0647  NA 141 139  K 4.2 4.2  CL 107 106  CO2 23 20*  GLUCOSE 124* 111*  BUN 16 16  CREATININE 1.32* 1.22  CALCIUM 9.9 9.4   GFR: Estimated Creatinine Clearance: 72.5 mL/min (by C-G formula based on SCr of 1.22 mg/dL). Liver Function Tests: No results for input(s): AST, ALT, ALKPHOS, BILITOT, PROT, ALBUMIN in the last 168 hours. No results for input(s): LIPASE, AMYLASE in the last 168 hours. No results for input(s): AMMONIA in the last 168 hours. Coagulation Profile: Recent Labs  Lab 02/14/19 0955  INR 1.1   Cardiac Enzymes: No results for input(s): CKTOTAL, CKMB, CKMBINDEX, TROPONINI in the last 168 hours. BNP (last 3 results) No results for input(s): PROBNP in the last 8760 hours. HbA1C: No results for input(s): HGBA1C in the last 72 hours. CBG: No results for input(s): GLUCAP in the last 168 hours. Lipid Profile: No results for input(s): CHOL, HDL, LDLCALC, TRIG, CHOLHDL, LDLDIRECT in the last 72 hours. Thyroid Function Tests: No results for input(s): TSH, T4TOTAL, FREET4, T3FREE, THYROIDAB in the last 72 hours. Anemia Panel: Recent Labs    02/12/19 1727  VITAMINB12 617  FOLATE 28.6  FERRITIN 1,265*  TIBC 316  IRON 74  RETICCTPCT 1.6   Sepsis Labs: No results for input(s): PROCALCITON, LATICACIDVEN in the last 168 hours.  Recent Results (from the past 240 hour(s))  SARS Coronavirus 2 [facility] order, Performed in [facility] hospital lab) Nasopharyngeal Nasopharyngeal Swab     Status: None   Collection Time: 02/12/19  1:23 PM   Specimen: Nasopharyngeal Swab  Result Value Ref Range Status   SARS Coronavirus 2 NEGATIVE NEGATIVE Final    Comment: (NOTE) If result is NEGATIVE SARS-CoV-2 target nucleic acids are NOT DETECTED. The SARS-CoV-2 RNA is generally detectable in upper and lower  respiratory specimens during the acute phase of  infection. The lowest  concentration of SARS-CoV-2 viral copies this assay can detect is 250  copies / mL. A negative result does not preclude SARS-CoV-2 infection  and should not be used as the sole basis for treatment or other  patient management decisions.  A negative result may occur with  improper specimen collection / handling, submission of specimen other  than nasopharyngeal swab, presence of viral mutation(s) within the  areas targeted by this assay, and inadequate number of viral copies  (<250 copies / mL). A negative result must be combined with clinical  observations, patient history, and epidemiological information. If result is POSITIVE SARS-CoV-2 target nucleic acids are DETECTED. The SARS-CoV-2 RNA is generally detectable in upper and lower  respiratory specimens dur ing the acute phase of infection.  Positive  results are indicative of active infection with SARS-CoV-2.  Clinical  correlation with patient history and other diagnostic information is  necessary to determine patient infection status.  Positive results do  not rule out bacterial infection or co-infection with other viruses. If result is PRESUMPTIVE POSTIVE SARS-CoV-2 nucleic acids MAY BE  PRESENT.   A presumptive positive result was obtained on the submitted specimen  and confirmed on repeat testing.  While 2019 novel coronavirus  (SARS-CoV-2) nucleic acids may be present in the submitted sample  additional confirmatory testing may be necessary for epidemiological  and / or clinical management purposes  to differentiate between  SARS-CoV-2 and other Sarbecovirus currently known to infect humans.  If clinically indicated additional testing with an alternate test  methodology [phone]) is advised. The SARS-CoV-2 RNA is generally  detectable in upper and lower respiratory sp ecimens during the acute  phase of infection. The expected result is Negative. Fact Sheet for Patients:   StrictlyIdeas.no Fact Sheet for Healthcare Providers: BankingDealers.co.za This test is not yet approved or cleared by the [place] FDA and has been authorized for detection and/or diagnosis of SARS-CoV-2 by FDA under an Emergency Use Authorization (EUA).  This EUA will remain in effect (meaning this test can be used) for the duration of the COVID-19 declaration under Section 564(b)(1) of the Act, 21 U.S.C. section 360bbb-3(b)(1), unless the authorization is terminated or revoked sooner. Performed at [facility], [place] [address], [place], [place] [address]          Radiology Studies: Ct Head W Contrast  Result Date: 02/13/2019 CLINICAL DATA:  Liver metastases.  Initial staging EXAM: CT HEAD WITHOUT AND WITH CONTRAST TECHNIQUE: Contiguous axial images were obtained from the base of the skull through the vertex without and with intravenous contrast CONTRAST:  164mL OMNIPAQUE IOHEXOL 300 MG/ML  SOLN COMPARISON:  None. FINDINGS: Brain: No evidence of acute infarction, hemorrhage, hydrocephalus, extra-axial collection or mass lesion/mass effect. No abnormal enhancement. Vascular: Major vessels are enhancing Skull: Posterior scalp high-density thickening without underlying lesion or fracture. Sinuses/Orbits: Negative IMPRESSION: 1. No acute intracranial finding or evidence of brain metastasis. 2. Posterior scalp thickening/hematoma without calvarial fracture. Electronically Signed   By: Monte Fantasia M.D.   On: 02/13/2019 11:40   Ct Abdomen Pelvis W Contrast  Result Date: 02/13/2019 CLINICAL DATA:  Hepatic masses on recent chest CT. Personal history of prostate carcinoma. EXAM: CT ABDOMEN AND PELVIS WITH CONTRAST TECHNIQUE: Multidetector CT imaging of the abdomen and pelvis was performed using the standard protocol following bolus administration of intravenous contrast. CONTRAST:  159mL OMNIPAQUE IOHEXOL 300 MG/ML  SOLN  COMPARISON:  Chest CTA on 02/12/2019 FINDINGS: Lower Chest: Bilateral lower lobe pulmonary emboli, as demonstrated on chest CTA performed yesterday. Bibasilar scarring. Hepatobiliary: Numerous small hypo vascular masses are seen throughout the right and left lobes, consistent with diffuse liver metastases. The largest index lesion near the junction of the right and left lobes measures 3.9 x 2.0 cm on image 26/3. Gallbladder is unremarkable. No evidence of biliary ductal dilatation. No evidence of portal or hepatic vein thrombosis. Pancreas:  No mass or inflammatory changes. Spleen: Within normal limits in size and appearance. Adrenals/Urinary Tract: No masses identified. A few tiny 2-3 mm right renal calculi are identified, however there is no evidence of ureteral calculi or hydronephrosis. Unremarkable unopacified urinary bladder. Stomach/Bowel: No evidence of obstruction, inflammatory process or abnormal fluid collections. Normal appendix visualized. Vascular/Lymphatic: No pathologically enlarged lymph nodes. No abdominal aortic aneurysm. Aortic atherosclerosis. Reproductive: Postop changes from previous prostatectomy and bilateral iliac lymph node dissection. Other:  None. Musculoskeletal:  No suspicious bone lesions identified. IMPRESSION: 1. Diffuse liver metastases. 2. No other sites of primary or metastatic carcinoma identified within the abdomen or pelvis. 3. Tiny nonobstructing right renal calculi. 4. Previous prostatectomy and bilateral  iliac lymph node dissection. Aortic Atherosclerosis (ICD10-I70.0). Electronically Signed   By: Marlaine Hind M.D.   On: 02/13/2019 10:44   US Abdomen Limited  Result Date: 02/14/2019 CLINICAL DATA:  68 year old male with CT imaging diagnosis of liver metastatic disease, referred for biopsy EXAM: ULTRASOUND ABDOMEN LIMITED RIGHT UPPER QUADRANT COMPARISON:  CT February 13, 2019 MEDICATIONS: 1 mg Versed and 50 mcg fentanyl The patient's level of consciousness and physiologic  status monitored by Radiology Nursing. FINDINGS: Ultrasound images were performed of the right upper quadrant anticipating a percutaneous biopsy of liver mass. Using multiple probe and multiple angles, ultrasound demonstrates no resolution of any of the small lesions that are evident on CT imaging. Biopsy was deferred at this time. The patient remained hemodynamically stable throughout. The patient will be transferred to CT imaging for attempt at CT-guided biopsy. IMPRESSION: Limited ultrasound performed anticipating ultrasound-guided biopsy, with no resolution of any of the previously demonstrated lesions. Biopsy deferred with ultrasound modality. Signed, Dulcy Fanny. Dellia Nims, RPVI [facility] Radiology Electronically Signed   By: Corrie Mckusick D.O.   On: 02/14/2019 12:49   Vas [place] Lower Extremity Venous (dvt)  Result Date: 02/14/2019  Lower Venous Study Indications: Pulmonary embolism.  Risk Factors: Confirmed PE. Comparison Study: No previous study available Performing Technologist: Toma Copier RVS  Examination Guidelines: A complete evaluation includes B-mode imaging, spectral Doppler, color Doppler, and power Doppler as needed of all accessible portions of each vessel. Bilateral testing is considered an integral part of a complete examination. Limited examinations for reoccurring indications may be performed as noted.  +---------+---------------+---------+-----------+----------+--------------+  RIGHT     Compressibility Phasicity Spontaneity Properties Thrombus Aging  +---------+---------------+---------+-----------+----------+--------------+  CFV       Full            Yes       Yes                                    +---------+---------------+---------+-----------+----------+--------------+  SFJ       Full                                                             +---------+---------------+---------+-----------+----------+--------------+  FV Prox    Full            Yes       Yes                                    +---------+---------------+---------+-----------+----------+--------------+  FV Mid    Full                                                             +---------+---------------+---------+-----------+----------+--------------+  FV Distal Full            Yes       Yes                                    +---------+---------------+---------+-----------+----------+--------------+  PFV       Full            Yes       Yes                                    +---------+---------------+---------+-----------+----------+--------------+  POP       Full            Yes       Yes                                    +---------+---------------+---------+-----------+----------+--------------+  PTV       Full                                                             +---------+---------------+---------+-----------+----------+--------------+  PERO      Full                                                             +---------+---------------+---------+-----------+----------+--------------+ Doppler signals somewhat pulsitile  +---------+---------------+---------+-----------+----------+--------------+  LEFT      Compressibility Phasicity Spontaneity Properties Thrombus Aging  +---------+---------------+---------+-----------+----------+--------------+  CFV       Full            Yes       Yes                                    +---------+---------------+---------+-----------+----------+--------------+  SFJ       Full                                                             +---------+---------------+---------+-----------+----------+--------------+  FV Prox   Full            Yes       Yes                                    +---------+---------------+---------+-----------+----------+--------------+  FV Mid    Full                                                             +---------+---------------+---------+-----------+----------+--------------+  FV Distal Full             Yes       Yes                                    +---------+---------------+---------+-----------+----------+--------------+  PFV       Full            Yes       Yes                                    +---------+---------------+---------+-----------+----------+--------------+  POP       Partial         No        Yes                    Acute           +---------+---------------+---------+-----------+----------+--------------+  PTV       Full                                                             +---------+---------------+---------+-----------+----------+--------------+  PERO      Full                                                             +---------+---------------+---------+-----------+----------+--------------+ Doppler signals somewhat pulsitile    Summary: Right: There is no evidence of deep vein thrombosis in the lower extremity. No cystic structure found in the popliteal fossa. Left: Findings consistent with acute deep vein thrombosis involving the left popliteal vein. No cystic structure found in the popliteal fossa.  *See table(s) above for measurements and observations. Electronically signed by Servando Snare MD on 02/14/2019 at 1:14:39 PM.    Final         Scheduled Meds:  docusate sodium  100 mg Oral BID   fentaNYL       folic acid  1 mg Oral Daily   lidocaine (PF)       midazolam       multivitamin with minerals  1 tablet Oral Daily   thiamine  100 mg Oral Daily   Continuous Infusions:  sodium chloride 75 mL/hr at 02/14/19 0500   heparin 1,500 Units/hr (02/14/19 0500)     LOS: 2 days    Time spent: 35 mins.More than 50% of that time was spent in counseling and/or coordination of care.      Shelly Coss, MD [facility] Pager [phone]  If 7PM-7AM, please contact night-coverage [email or web address] Password [email or web address] 02/14/2019, 1:51 PM

## 2019-02-14 NOTE — Progress Notes (Addendum)
[facility] for heparin, apixaban Indication: r/o PE  Allergies  Allergen Reactions  . Latex Itching  . Neosporin [Bacitracin-Polymyxin B]     Patient Measurements: Height: 6' (182.9 cm) Weight: 245 lb 2.4 oz (111.2 kg) IBW/kg (Calculated) : 77.6 Heparin Dosing Weight: 101 kg  Vital Signs: Temp: 98.3 F (36.8 C) (09/08 0639) Temp Source: Oral (09/08 0639) BP: 142/95 (09/08 1330) Pulse Rate: 91 (09/08 1330)  Labs: Recent Labs    02/12/19 1037 02/12/19 1304 02/12/19 1905 02/12/19 2221 02/13/19 0647 02/14/19 0448 02/14/19 0955  HGB 16.0  --   --   --  15.6 13.6  --   HCT 48.3  --   --   --  45.6 39.7  --   PLT 143*  --   --   --  143* 129*  --   LABPROT  --   --   --   --   --   --  13.6  INR  --   --   --   --   --   --  1.1  HEPARINUNFRC  --   --   --  0.86* 0.65 0.46  --   CREATININE 1.32*  --   --   --  1.22  --   --   TROPONINIHS 439* 1,007* 925*  --   --   --   --     Estimated Creatinine Clearance: 72.5 mL/min (by C-G formula based on SCr of 1.22 mg/dL).   Medical History: Past Medical History:  Diagnosis Date  . Cancer [facility]) 1998   prostate    Assessment: 68 yo M to start heparin per pharmacy for bilateral PE and LLE DVT.  No anticoagulants PTA. He is noted with prostate cancer and likely metastasis s/p liver biopsy.    Will restart heparin 6 hours post procedure.  Goal of Therapy:  Heparin level 0.3-0.7 units/ml Monitor platelets by anticoagulation protocol: Yes    Plan:  Restart heparin IV infusion at 1500 units/hour at 7pm Check heparin level and CBC daily Will plan to start apixaban 10mg  bid on 9/9 (transition to 5mg  po bid after 7 days)   Hildred Laser, PharmD Clinical Pharmacist **Pharmacist phone directory can now be found on [email or web address] (PW [email or web address]).  Listed under [place].

## 2019-02-14 NOTE — Progress Notes (Signed)
PT Cancellation Note  Patient Details Name: Kenneth Lang MRN: [id number] DOB: 20-Sep-1950   Cancelled Treatment:    Reason Eval/Treat Not Completed: (P) Patient at procedure or test/unavailable Pt off floor for liver biopsy. PT will follow back for Evaluation this afternoon as able.   Wilbern Pennypacker B. Migdalia Dk PT, DPT Acute Rehabilitation Services Pager [phone] Office [phone]  [place] 02/14/2019, 11:52 AM

## 2019-02-14 NOTE — Progress Notes (Signed)
Chief Complaint: Patient was seen in consultation today for liver biopsy at the request of Dr. Tawanna Solo  Referring Physician(s): Dr. Tawanna Solo  Supervising Physician: Corrie Mckusick  Patient Status: [facility] - In-pt  History of Present Illness: Kenneth Lang is a 68 y.o. male admitted with chest pain. He was found to have acute PE and was started on heparin gtt. His workup also found evidence of multiple liver lesions suspicious for metastatic process. Hx of prostate carcinoma. IR is asked to perform liver lesion biopsy PMHx, meds, labs, imaging, allergies reviewed. Feels well, no recent fevers, chills, illness. Has been NPO today as directed.   Past Medical History:  Diagnosis Date   Cancer [facility]) 1998   prostate    Past Surgical History:  Procedure Laterality Date   KNEE SURGERY     PROSTATECTOMY     TONSILLECTOMY      Allergies: Patient has no known allergies.  Medications:  Current Facility-Administered Medications:    0.9 %  sodium chloride infusion, , Intravenous, Continuous, Adhikari, Amrit, MD, Last Rate: 75 mL/hr at 02/14/19 0500   acetaminophen (TYLENOL) tablet 650 mg, 650 mg, Oral, Q6H PRN, 650 mg at 02/13/19 1723 **OR** acetaminophen (TYLENOL) suppository 650 mg, 650 mg, Rectal, Q6H PRN, Gonfa, Taye T, MD   albuterol (PROVENTIL) (2.5 MG/3ML) 0.083% nebulizer solution 2.5 mg, 2.5 mg, Nebulization, Q2H PRN, Gonfa, Taye T, MD   bisacodyl (DULCOLAX) suppository 10 mg, 10 mg, Rectal, Daily PRN, Cyndia Skeeters, Taye T, MD   docusate sodium (COLACE) capsule 100 mg, 100 mg, Oral, BID, Cyndia Skeeters, Taye T, MD, 100 mg at [id number]   folic acid (FOLVITE) tablet 1 mg, 1 mg, Oral, Daily, Gonfa, Taye T, MD, 1 mg at 02/13/19 1014   heparin ADULT infusion 100 units/mL (25000 units/254mL sodium chloride 0.45%), 1,500 Units/hr, Intravenous, Continuous, Samuella Cota, MD, Last Rate: 15 mL/hr at 02/14/19 0500, 1,500 Units/hr at 02/14/19 0500   LORazepam (ATIVAN) injection  2-3 mg, 2-3 mg, Intravenous, Q1H PRN, Mercy Riding, MD   multivitamin with minerals tablet 1 tablet, 1 tablet, Oral, Daily, Cyndia Skeeters, Taye T, MD, 1 tablet at 02/13/19 1013   ondansetron (ZOFRAN) tablet 4 mg, 4 mg, Oral, Q6H PRN **OR** ondansetron (ZOFRAN) injection 4 mg, 4 mg, Intravenous, Q6H PRN, Gonfa, Taye T, MD   senna-docusate (Senokot-S) tablet 1 tablet, 1 tablet, Oral, QHS PRN, Gonfa, Taye T, MD   sodium phosphate (FLEET) 7-19 GM/118ML enema 1 enema, 1 enema, Rectal, Once PRN, Gonfa, Taye T, MD   thiamine (VITAMIN B-1) tablet 100 mg, 100 mg, Oral, Daily, Gonfa, Taye T, MD, 100 mg at 02/13/19 1014    History reviewed. No pertinent family history.  Social History   Socioeconomic History   Marital status: Divorced    Spouse name: Not on file   Number of children: Not on file   Years of education: Not on file   Highest education level: Not on file  Occupational History   Not on file  Social Needs   Financial resource strain: Not very hard   Food insecurity    Worry: Never true    Inability: Never true   Transportation needs    Medical: No    Non-medical: No  Tobacco Use   Smoking status: Never Smoker   Smokeless tobacco: Never Used  Substance and Sexual Activity   Alcohol use: Yes    Alcohol/week: 5.0 standard drinks    Types: 5 Shots of liquor per week    Comment: nightly  Drug use: Never   Sexual activity: Not Currently  Lifestyle   Physical activity    Days per week: 6 days    Minutes per session: 60 min   Stress: Not at all  Relationships   Social connections    Talks on phone: Three times a week    Gets together: Twice a week    Attends religious service: More than 4 times per year    Active member of club or organization: No    Attends meetings of clubs or organizations: Never    Relationship status: Living with partner  Other Topics Concern   Not on file  Social History Narrative   Not on file     Review of Systems: A 12 point  ROS discussed and pertinent positives are indicated in the HPI above.  All other systems are negative.  Review of Systems  Vital Signs: BP (!) 145/95 (BP Location: Left Arm)    Pulse 92    Temp 98.3 F (36.8 C) (Oral)    Resp 20    Ht 6' (1.829 m)    Wt 111.2 kg    SpO2 97%    BMI 33.25 kg/m   Physical Exam Constitutional:      Appearance: He is well-developed.  HENT:     Head: Normocephalic.     Mouth/Throat:     Mouth: Mucous membranes are moist.     Pharynx: Oropharynx is clear.  Cardiovascular:     Rate and Rhythm: Normal rate and regular rhythm.     Heart sounds: Normal heart sounds.  Pulmonary:     Effort: Pulmonary effort is normal. No respiratory distress.     Breath sounds: Normal breath sounds.  Abdominal:     General: Abdomen is flat. There is no distension.     Palpations: Abdomen is soft.     Tenderness: There is no abdominal tenderness.  Skin:    General: Skin is warm and dry.  Neurological:     General: No focal deficit present.     Mental Status: He is alert and oriented to person, place, and time.  Psychiatric:        Mood and Affect: Mood normal.        Judgment: Judgment normal.     Imaging: Dg Chest 2 View  Result Date: 02/12/2019 CLINICAL DATA:  Chest pain, shortness of breath, and fatigue for several days. Diaphoretic. Personal history of prostate carcinoma. EXAM: CHEST - 2 VIEW COMPARISON:  11/30/2008 from [place]: The heart size and mediastinal contours are within normal limits. Both lungs are clear. The visualized skeletal structures are unremarkable. IMPRESSION: Stable exam.  No active cardiopulmonary disease. Electronically Signed   By: Marlaine Hind M.D.   On: 02/12/2019 11:06   Ct Head W Contrast  Result Date: 02/13/2019 CLINICAL DATA:  Liver metastases.  Initial staging EXAM: CT HEAD WITHOUT AND WITH CONTRAST TECHNIQUE: Contiguous axial images were obtained from the base of the skull through the vertex without and with  intravenous contrast CONTRAST:  138mL OMNIPAQUE IOHEXOL 300 MG/ML  SOLN COMPARISON:  None. FINDINGS: Brain: No evidence of acute infarction, hemorrhage, hydrocephalus, extra-axial collection or mass lesion/mass effect. No abnormal enhancement. Vascular: Major vessels are enhancing Skull: Posterior scalp high-density thickening without underlying lesion or fracture. Sinuses/Orbits: Negative IMPRESSION: 1. No acute intracranial finding or evidence of brain metastasis. 2. Posterior scalp thickening/hematoma without calvarial fracture. Electronically Signed   By: Monte Fantasia M.D.   On: 02/13/2019 11:40  Ct Angio Chest Pe W And/or Wo Contrast  Result Date: 02/12/2019 CLINICAL DATA:  Chest pain since this morning, congestion for 3 days, diaphoresis, suspected pulmonary embolism high pretest probability; history prostate cancer EXAM: CT ANGIOGRAPHY CHEST WITH CONTRAST TECHNIQUE: Multidetector CT imaging of the chest was performed using the standard protocol during bolus administration of intravenous contrast. Multiplanar CT image reconstructions and MIPs were obtained to evaluate the vascular anatomy. CONTRAST:  153mL OMNIPAQUE IOHEXOL 350 MG/ML SOLN IV COMPARISON:  None FINDINGS: Cardiovascular: Atherosclerotic calcifications aorta and coronary arteries. Aorta normal caliber without aneurysm or dissection. Pulmonary arteries well opacified. Multiple pulmonary emboli are present. Saddle emboli are identified at the bifurcations of the RIGHT and LEFT pulmonary arteries extending into multiple lobes. Additional smaller emboli are seen in RIGHT middle and RIGHT upper lobes as well as LEFT upper lobe. Dilatation of RIGHT ventricle and RIGHT atrium. RV/LV ratio = 2.28, significantly increased. Mediastinum/Nodes: Esophagus unremarkable. Base of cervical region normal appearance. Few scattered normal sized mediastinal lymph nodes. No thoracic adenopathy. Lungs/Pleura: Subsegmental atelectasis BILATERAL lower lobes. No  infiltrate, pleural effusion or pneumothorax. Upper Abdomen: Poorly defined low-attenuation foci in liver suspicious for metastatic disease. Remaining visualized upper abdomen unremarkable. Musculoskeletal: Degenerative disc disease changes thoracic spine. No definite osseous metastatic lesions. Review of the MIP images confirms the above findings. IMPRESSION: Extensive BILATERAL pulmonary emboli including saddle emboli the bifurcations of the RIGHT and LEFT pulmonary arteries. Significant dilatation of the RIGHT atrium and RIGHT ventricle. Positive for acute PE with CT evidence of right heart strain (RV/LV Ratio = 2.28) consistent with at least submassive (intermediate risk) PE. The presence of right heart strain has been associated with an increased risk of morbidity and mortality. Please activate Code PE by paging [phone]. Bibasilar atelectasis. Probable hepatic metastatic disease. Aortic Atherosclerosis (ICD10-I70.0). Critical Value/emergent results were called by telephone at the time of interpretation on 02/12/2019 at 1340 hrs to Dr. Dorie Rank, who verbally acknowledged these results. Electronically Signed   By: Lavonia Dana M.D.   On: 02/12/2019 13:41   Ct Abdomen Pelvis W Contrast  Result Date: 02/13/2019 CLINICAL DATA:  Hepatic masses on recent chest CT. Personal history of prostate carcinoma. EXAM: CT ABDOMEN AND PELVIS WITH CONTRAST TECHNIQUE: Multidetector CT imaging of the abdomen and pelvis was performed using the standard protocol following bolus administration of intravenous contrast. CONTRAST:  166mL OMNIPAQUE IOHEXOL 300 MG/ML  SOLN COMPARISON:  Chest CTA on 02/12/2019 FINDINGS: Lower Chest: Bilateral lower lobe pulmonary emboli, as demonstrated on chest CTA performed yesterday. Bibasilar scarring. Hepatobiliary: Numerous small hypo vascular masses are seen throughout the right and left lobes, consistent with diffuse liver metastases. The largest index lesion near the junction of the right and  left lobes measures 3.9 x 2.0 cm on image 26/3. Gallbladder is unremarkable. No evidence of biliary ductal dilatation. No evidence of portal or hepatic vein thrombosis. Pancreas:  No mass or inflammatory changes. Spleen: Within normal limits in size and appearance. Adrenals/Urinary Tract: No masses identified. A few tiny 2-3 mm right renal calculi are identified, however there is no evidence of ureteral calculi or hydronephrosis. Unremarkable unopacified urinary bladder. Stomach/Bowel: No evidence of obstruction, inflammatory process or abnormal fluid collections. Normal appendix visualized. Vascular/Lymphatic: No pathologically enlarged lymph nodes. No abdominal aortic aneurysm. Aortic atherosclerosis. Reproductive: Postop changes from previous prostatectomy and bilateral iliac lymph node dissection. Other:  None. Musculoskeletal:  No suspicious bone lesions identified. IMPRESSION: 1. Diffuse liver metastases. 2. No other sites of primary or metastatic carcinoma identified within  the abdomen or pelvis. 3. Tiny nonobstructing right renal calculi. 4. Previous prostatectomy and bilateral iliac lymph node dissection. Aortic Atherosclerosis (ICD10-I70.0). Electronically Signed   By: Marlaine Hind M.D.   On: 02/13/2019 10:44   Vas [place] Lower Extremity Venous (dvt)  Result Date: 02/13/2019  Lower Venous Study Indications: Pulmonary embolism.  Risk Factors: Confirmed PE. Comparison Study: No previous study available Performing Technologist: Toma Copier RVS  Examination Guidelines: A complete evaluation includes B-mode imaging, spectral Doppler, color Doppler, and power Doppler as needed of all accessible portions of each vessel. Bilateral testing is considered an integral part of a complete examination. Limited examinations for reoccurring indications may be performed as noted.  +---------+---------------+---------+-----------+----------+--------------+  RIGHT      Compressibility Phasicity Spontaneity Properties Thrombus Aging  +---------+---------------+---------+-----------+----------+--------------+  CFV       Full            Yes       Yes                                    +---------+---------------+---------+-----------+----------+--------------+  SFJ       Full                                                             +---------+---------------+---------+-----------+----------+--------------+  FV Prox   Full            Yes       Yes                                    +---------+---------------+---------+-----------+----------+--------------+  FV Mid    Full                                                             +---------+---------------+---------+-----------+----------+--------------+  FV Distal Full            Yes       Yes                                    +---------+---------------+---------+-----------+----------+--------------+  PFV       Full            Yes       Yes                                    +---------+---------------+---------+-----------+----------+--------------+  POP       Full            Yes       Yes                                    +---------+---------------+---------+-----------+----------+--------------+  PTV       Full                                                             +---------+---------------+---------+-----------+----------+--------------+  PERO      Full                                                             +---------+---------------+---------+-----------+----------+--------------+ Doppler signals somewhat pulsitile  +---------+---------------+---------+-----------+----------+--------------+  LEFT      Compressibility Phasicity Spontaneity Properties Thrombus Aging  +---------+---------------+---------+-----------+----------+--------------+  CFV       Full            Yes       Yes                                    +---------+---------------+---------+-----------+----------+--------------+  SFJ       Full                                                              +---------+---------------+---------+-----------+----------+--------------+  FV Prox   Full            Yes       Yes                                    +---------+---------------+---------+-----------+----------+--------------+  FV Mid    Full                                                             +---------+---------------+---------+-----------+----------+--------------+  FV Distal Full            Yes       Yes                                    +---------+---------------+---------+-----------+----------+--------------+  PFV       Full            Yes       Yes                                    +---------+---------------+---------+-----------+----------+--------------+  POP       Partial         No        Yes                    Acute           +---------+---------------+---------+-----------+----------+--------------+  PTV       Full                                                             +---------+---------------+---------+-----------+----------+--------------+  PERO      Full                                                             +---------+---------------+---------+-----------+----------+--------------+ Doppler signals somewhat pulsitile    Summary: Right: There is no evidence of deep vein thrombosis in the lower extremity. No cystic structure found in the popliteal fossa. Left: Findings consistent with acute deep vein thrombosis involving the left popliteal vein. No cystic structure found in the popliteal fossa.  *See table(s) above for measurements and observations.    Preliminary     Labs:  CBC: Recent Labs    02/12/19 1037 02/13/19 0647 02/14/19 0448  WBC 7.6 6.6 5.2  HGB 16.0 15.6 13.6  HCT 48.3 45.6 39.7  PLT 143* 143* 129*    COAGS: No results for input(s): INR, APTT in the last 8760 hours.  BMP: Recent Labs    02/12/19 1037 02/13/19 0647  NA 141 139  K 4.2 4.2  CL 107 106  CO2 23 20*  GLUCOSE 124* 111*  BUN  16 16  CALCIUM 9.9 9.4  CREATININE 1.32* 1.22  GFRNONAA 54* 60*  GFRAA >60 >60     Assessment and Plan: Liver lesions. Hx of prostate cancer For US guided liver lesion biopsy. Labs reviewed. Will stop heparin 2-3 hrs prior. Risks and benefits of liver lesion biopsy was discussed with the patient and/or patient's family including, but not limited to bleeding, infection, damage to adjacent structures or low yield requiring additional tests.  All of the questions were answered and there is agreement to proceed.  Consent signed and in chart.    Thank you for this interesting consult.  I greatly enjoyed meeting Kenneth Lang and look forward to participating in their care.  A copy of this report was sent to the requesting provider on this date.  Electronically Signed: Ascencion Dike, PA-C 02/14/2019, 9:43 AM   I spent a total of 20 minutes in face to face in clinical consultation, greater than 50% of which was counseling/coordinating care for liver biopsy

## 2019-02-14 NOTE — Care Management (Signed)
02-14-19 1143 Benefits Check submitted for Xarelto and Eliquis. CM will make patient aware of cost once completed. Bethena Roys, RN,BSN Case Manager [phone]

## 2019-02-14 NOTE — TOC Benefit Eligibility Note (Signed)
Transition of Care [facility]) Benefit Eligibility Note    Patient Details  Name: Kenneth Lang MRN: [id number] Date of Birth: [birth year]   Medication/Dose: Alveda Reasons 15 MG  BID  , XARELTO 20 MG DAILY ,   ELIQUIS  5 MG  , ELIQUIS 2.5 MG BID  Covered?: Yes  Tier: 3 Drug  Prescription Coverage Preferred Pharmacy: [place] with Person/Company/Phone Number:: [employer] @  [employer] # [phone] OPT-2  Co-Pay: $45.00  Prior Approval: No  Deductible: Met(OUT-OF-POCKET- NOT MET)  Additional Notes: ELIQUIS 10 MG : NON-FORMULARY    [name] Phone Number: 02/14/2019, 12:57 PM

## 2019-02-14 NOTE — Progress Notes (Signed)
[place] for heparin Indication: r/o PE  No Known Allergies  Patient Measurements: Height: 6' (182.9 cm) Weight: 245 lb 2.4 oz (111.2 kg) IBW/kg (Calculated) : 77.6 Heparin Dosing Weight: 101 kg  Vital Signs: Temp: 98.3 F (36.8 C) (09/08 0639) Temp Source: Oral (09/08 0639) BP: 145/95 (09/08 0639) Pulse Rate: 92 (09/08 0639)  Labs: Recent Labs    02/12/19 1037 02/12/19 1304 02/12/19 1905 02/12/19 2221 02/13/19 0647 02/14/19 0448 02/14/19 0955  HGB 16.0  --   --   --  15.6 13.6  --   HCT 48.3  --   --   --  45.6 39.7  --   PLT 143*  --   --   --  143* 129*  --   LABPROT  --   --   --   --   --   --  13.6  INR  --   --   --   --   --   --  1.1  HEPARINUNFRC  --   --   --  0.86* 0.65 0.46  --   CREATININE 1.32*  --   --   --  1.22  --   --   TROPONINIHS 439* 1,007* 925*  --   --   --   --     Estimated Creatinine Clearance: 72.5 mL/min (by C-G formula based on SCr of 1.22 mg/dL).   Medical History: Past Medical History:  Diagnosis Date  . Cancer [facility]) 1998   prostate    Assessment: 68 yo M to start heparin per pharmacy for bilateral PE and LLE DVT.  No anticoagulants PTA. He is noted with prostate cancer and likely metastasis (plans for liver biopsy).    Goal of Therapy:  Heparin level 0.3-0.7 units/ml Monitor platelets by anticoagulation protocol: Yes    Plan:  Continue heparin IV infusion at 1500 units/hour  Check heparin level and CBC daily Will follow timing of liver biopsy   Hildred Laser, PharmD Clinical Pharmacist **Pharmacist phone directory can now be found on [email or web address] ([email or web address]).  Listed under [place].

## 2019-02-15 LAB — HEPATIC FUNCTION PANEL
ALT: 59 U/L — ABNORMAL HIGH (ref 0–44)
AST: 37 U/L (ref 15–41)
Albumin: 2.9 g/dL — ABNORMAL LOW (ref 3.5–5.0)
Alkaline Phosphatase: 47 U/L (ref 38–126)
Bilirubin, Direct: 0.2 mg/dL (ref 0.0–0.2)
Indirect Bilirubin: 0.9 mg/dL (ref 0.3–0.9)
Total Bilirubin: 1.1 mg/dL (ref 0.3–1.2)
Total Protein: 6.1 g/dL — ABNORMAL LOW (ref 6.5–8.1)

## 2019-02-15 LAB — CBC
HCT: 39.2 % (ref 39.0–52.0)
Hemoglobin: 13.7 g/dL (ref 13.0–17.0)
MCH: 35.7 pg — ABNORMAL HIGH (ref 26.0–34.0)
MCHC: 34.9 g/dL (ref 30.0–36.0)
MCV: 102.1 fL — ABNORMAL HIGH (ref 80.0–100.0)
Platelets: 145 10*3/uL — ABNORMAL LOW (ref 150–400)
RBC: 3.84 MIL/uL — ABNORMAL LOW (ref 4.22–5.81)
RDW: 13.4 % (ref 11.5–15.5)
WBC: 5.4 10*3/uL (ref 4.0–10.5)
nRBC: 0 % (ref 0.0–0.2)

## 2019-02-15 LAB — HIV ANTIBODY (ROUTINE TESTING W REFLEX): HIV Screen 4th Generation wRfx: NONREACTIVE

## 2019-02-15 LAB — HEPARIN LEVEL (UNFRACTIONATED): Heparin Unfractionated: 0.32 IU/mL (ref 0.30–0.70)

## 2019-02-15 MED ORDER — APIXABAN 5 MG PO TABS: 10.0000 mg | ORAL_TABLET | Freq: Two times a day (BID) | ORAL | Status: DC

## 2019-02-15 MED ORDER — APIXABAN 5 MG PO TABS: 5.0000 mg | ORAL_TABLET | Freq: Two times a day (BID) | ORAL | Status: DC

## 2019-02-15 MED ORDER — APIXABAN 5 MG PO TABS: 5.0000 mg | ORAL_TABLET | Freq: Two times a day (BID) | ORAL | 0 refills | Status: AC

## 2019-02-15 MED ORDER — APIXABAN 5 MG PO TABS: 10.0000 mg | ORAL_TABLET | Freq: Two times a day (BID) | ORAL | 0 refills | Status: AC

## 2019-02-15 NOTE — Discharge Summary (Signed)
Physician Discharge Summary  Kenneth Lang [id number] DOB: 11/29/1950 DOA: 02/12/2019  PCP: Lajean Manes, MD  Admit date: 02/12/2019 Discharge date: 02/15/2019  Admitted From: Home Disposition:  Home  Discharge Condition:Stable CODE STATUS:FULL Diet recommendation: Heart Healthy    Brief/Interim Summary: Patient is 68 year old male with history of hypertension, OSA on CPAP, remote history of prostate cancer which was treated surgically who presented with chest pain.  On presentation, he was found to be tachycardic with d-dimer was greater than 20.    CT chest revealed acute PE with right heart strain.He was initially started on heparin drip which has been changed to Eliquis now. CT abdomen/pelvis showed hepatic metastasis. He underwent CT-guided liver biopsy for evaluation of hepatic metastatic lesions.    Patient's information has been forwarded to oncology who will follow up with biopsy report and give a call to the patient for appointment if necessary. He is hemodynamically stable for discharge to home today.  Following problems were addressed during his hospitalization:  Acute pulmonary embolism: Presented with chest pain.  CT imaging showed bilateral pulmonary embolism with significant right heart strain.  Elevated d-dimer and  troponin.   Admitting physician had discussed with IR and PCCM.  IR did not recommend catheter directed thrombolysis due to possible coexisting malignancy. Started on Eliquis.  Hepatic metastatic disease: CT abdomen/pelvis showed hepatic metastasis.  No finding of primary source as per imaging. History of remote prostate cancer that was treated surgically . Case was  discussed with Dr. Julien Nordmann, oncology .S/P biopsy of liver.He will follow up with oncology as an outpatient.I have forwarded all information to Dr Julien Nordmann who will follow up the biopsy report and call for appointment if necessary.  Hypertension: Currently normotensive.    Continue home  medicines.  OSA: Continue CPAP at night.  Alcohol use: Reports drinking daily.    Counseled for alcohol cessation.  Macrocytosis/thrombocytopenia: Most likely secondary to chronic alcohol use.  Continue to monitor.  Vitamin [id number], folic acid level normal.  AKI: Resolved  Discharge Diagnoses:  Active Problems:   Pulmonary embolism [facility])    Discharge Instructions  Discharge Instructions    Diet - low sodium heart healthy   Complete by: As directed    Discharge instructions   Complete by: As directed    1)Please follow up with your PCP in a week. 2)You will be called  if there is an abnormality in biopsy report.I will also forward your medical information to your PCP who can also follow up on the biopsy report. 3)Take prescribed medications as instructed. 4)Please stop alcohol consumption.   Increase activity slowly   Complete by: As directed      Allergies as of 02/15/2019      Reactions   Latex Itching   Neosporin [bacitracin-polymyxin B]       Medication List    TAKE these medications   allopurinol 300 MG tablet Commonly known as: ZYLOPRIM Take 300 mg by mouth daily.   amLODipine 2.5 MG tablet Commonly known as: NORVASC Take 2.5 mg by mouth daily.   apixaban 5 MG Tabs tablet Commonly known as: ELIQUIS Take 2 tablets (10 mg total) by mouth 2 (two) times daily for 7 days.   apixaban 5 MG Tabs tablet Commonly known as: ELIQUIS Take 1 tablet (5 mg total) by mouth 2 (two) times daily. Start taking on: February 22, 2019   cetirizine-pseudoephedrine 5-120 MG tablet Commonly known as: ZYRTEC-D Take 1 tablet by mouth 2 (two) times daily.   FISH OIL  ADULT GUMMIES PO Take by mouth.   fluticasone 50 MCG/ACT nasal spray Commonly known as: FLONASE Place 2 sprays into both nostrils daily.   lisinopril 10 MG tablet Commonly known as: ZESTRIL Take 10 mg by mouth daily.   multivitamin with minerals Tabs tablet Take 1 tablet by mouth daily.   VITAMIN D3  PO Take by mouth.      Follow-up Information    Stoneking, Hal, MD. Schedule an appointment as soon as possible for a visit in 1 week(s).   Specialty: Internal Medicine Contact information: [address] [place] [address] [phone]          Allergies  Allergen Reactions  . Latex Itching  . Neosporin [Bacitracin-Polymyxin B]     Consultations:  None   Procedures/Studies: Dg Chest 2 View  Result Date: 02/12/2019 CLINICAL DATA:  Chest pain, shortness of breath, and fatigue for several days. Diaphoretic. Personal history of prostate carcinoma. EXAM: CHEST - 2 VIEW COMPARISON:  11/30/2008 from [place]: The heart size and mediastinal contours are within normal limits. Both lungs are clear. The visualized skeletal structures are unremarkable. IMPRESSION: Stable exam.  No active cardiopulmonary disease. Electronically Signed   By: Marlaine Hind M.D.   On: 02/12/2019 11:06   Ct Head W Contrast  Result Date: 02/13/2019 CLINICAL DATA:  Liver metastases.  Initial staging EXAM: CT HEAD WITHOUT AND WITH CONTRAST TECHNIQUE: Contiguous axial images were obtained from the base of the skull through the vertex without and with intravenous contrast CONTRAST:  146mL OMNIPAQUE IOHEXOL 300 MG/ML  SOLN COMPARISON:  None. FINDINGS: Brain: No evidence of acute infarction, hemorrhage, hydrocephalus, extra-axial collection or mass lesion/mass effect. No abnormal enhancement. Vascular: Major vessels are enhancing Skull: Posterior scalp high-density thickening without underlying lesion or fracture. Sinuses/Orbits: Negative IMPRESSION: 1. No acute intracranial finding or evidence of brain metastasis. 2. Posterior scalp thickening/hematoma without calvarial fracture. Electronically Signed   By: Monte Fantasia M.D.   On: 02/13/2019 11:40   Ct Angio Chest Pe W And/or Wo Contrast  Result Date: 02/12/2019 CLINICAL DATA:  Chest pain since this morning, congestion for 3 days,  diaphoresis, suspected pulmonary embolism high pretest probability; history prostate cancer EXAM: CT ANGIOGRAPHY CHEST WITH CONTRAST TECHNIQUE: Multidetector CT imaging of the chest was performed using the standard protocol during bolus administration of intravenous contrast. Multiplanar CT image reconstructions and MIPs were obtained to evaluate the vascular anatomy. CONTRAST:  165mL OMNIPAQUE IOHEXOL 350 MG/ML SOLN IV COMPARISON:  None FINDINGS: Cardiovascular: Atherosclerotic calcifications aorta and coronary arteries. Aorta normal caliber without aneurysm or dissection. Pulmonary arteries well opacified. Multiple pulmonary emboli are present. Saddle emboli are identified at the bifurcations of the RIGHT and LEFT pulmonary arteries extending into multiple lobes. Additional smaller emboli are seen in RIGHT middle and RIGHT upper lobes as well as LEFT upper lobe. Dilatation of RIGHT ventricle and RIGHT atrium. RV/LV ratio = 2.28, significantly increased. Mediastinum/Nodes: Esophagus unremarkable. Base of cervical region normal appearance. Few scattered normal sized mediastinal lymph nodes. No thoracic adenopathy. Lungs/Pleura: Subsegmental atelectasis BILATERAL lower lobes. No infiltrate, pleural effusion or pneumothorax. Upper Abdomen: Poorly defined low-attenuation foci in liver suspicious for metastatic disease. Remaining visualized upper abdomen unremarkable. Musculoskeletal: Degenerative disc disease changes thoracic spine. No definite osseous metastatic lesions. Review of the MIP images confirms the above findings. IMPRESSION: Extensive BILATERAL pulmonary emboli including saddle emboli the bifurcations of the RIGHT and LEFT pulmonary arteries. Significant dilatation of the RIGHT atrium and RIGHT ventricle. Positive for acute  PE with CT evidence of right heart strain (RV/LV Ratio = 2.28) consistent with at least submassive (intermediate risk) PE. The presence of right heart strain has been associated with an  increased risk of morbidity and mortality. Please activate Code PE by paging [phone]. Bibasilar atelectasis. Probable hepatic metastatic disease. Aortic Atherosclerosis (ICD10-I70.0). Critical Value/emergent results were called by telephone at the time of interpretation on 02/12/2019 at 1340 hrs to Dr. Dorie Rank, who verbally acknowledged these results. Electronically Signed   By: Lavonia Dana M.D.   On: 02/12/2019 13:41   Ct Abdomen Pelvis W Contrast  Result Date: 02/13/2019 CLINICAL DATA:  Hepatic masses on recent chest CT. Personal history of prostate carcinoma. EXAM: CT ABDOMEN AND PELVIS WITH CONTRAST TECHNIQUE: Multidetector CT imaging of the abdomen and pelvis was performed using the standard protocol following bolus administration of intravenous contrast. CONTRAST:  140mL OMNIPAQUE IOHEXOL 300 MG/ML  SOLN COMPARISON:  Chest CTA on 02/12/2019 FINDINGS: Lower Chest: Bilateral lower lobe pulmonary emboli, as demonstrated on chest CTA performed yesterday. Bibasilar scarring. Hepatobiliary: Numerous small hypo vascular masses are seen throughout the right and left lobes, consistent with diffuse liver metastases. The largest index lesion near the junction of the right and left lobes measures 3.9 x 2.0 cm on image 26/3. Gallbladder is unremarkable. No evidence of biliary ductal dilatation. No evidence of portal or hepatic vein thrombosis. Pancreas:  No mass or inflammatory changes. Spleen: Within normal limits in size and appearance. Adrenals/Urinary Tract: No masses identified. A few tiny 2-3 mm right renal calculi are identified, however there is no evidence of ureteral calculi or hydronephrosis. Unremarkable unopacified urinary bladder. Stomach/Bowel: No evidence of obstruction, inflammatory process or abnormal fluid collections. Normal appendix visualized. Vascular/Lymphatic: No pathologically enlarged lymph nodes. No abdominal aortic aneurysm. Aortic atherosclerosis. Reproductive: Postop changes from  previous prostatectomy and bilateral iliac lymph node dissection. Other:  None. Musculoskeletal:  No suspicious bone lesions identified. IMPRESSION: 1. Diffuse liver metastases. 2. No other sites of primary or metastatic carcinoma identified within the abdomen or pelvis. 3. Tiny nonobstructing right renal calculi. 4. Previous prostatectomy and bilateral iliac lymph node dissection. Aortic Atherosclerosis (ICD10-I70.0). Electronically Signed   By: Marlaine Hind M.D.   On: 02/13/2019 10:44   US Abdomen Limited  Result Date: 02/14/2019 CLINICAL DATA:  68 year old male with CT imaging diagnosis of liver metastatic disease, referred for biopsy EXAM: ULTRASOUND ABDOMEN LIMITED RIGHT UPPER QUADRANT COMPARISON:  CT February 13, 2019 MEDICATIONS: 1 mg Versed and 50 mcg fentanyl The patient's level of consciousness and physiologic status monitored by Radiology Nursing. FINDINGS: Ultrasound images were performed of the right upper quadrant anticipating a percutaneous biopsy of liver mass. Using multiple probe and multiple angles, ultrasound demonstrates no resolution of any of the small lesions that are evident on CT imaging. Biopsy was deferred at this time. The patient remained hemodynamically stable throughout. The patient will be transferred to CT imaging for attempt at CT-guided biopsy. IMPRESSION: Limited ultrasound performed anticipating ultrasound-guided biopsy, with no resolution of any of the previously demonstrated lesions. Biopsy deferred with ultrasound modality. Signed, Dulcy Fanny. Dellia Nims, RPVI [facility] Radiology Electronically Signed   By: Corrie Mckusick D.O.   On: 02/14/2019 12:49   Ct Biopsy  Result Date: 02/14/2019 INDICATION: 68 year old male with a history multiple liver lesions, referred for biopsy. Ultrasound guided biopsy was attempted, without visualization/resolution of the multiple lesions. EXAM: CT BIOPSY MEDICATIONS: None. ANESTHESIA/SEDATION:  Moderate (conscious) sedation was employed during this procedure. A total  of Versed 2.0 mg and Fentanyl 100 mcg was administered intravenously. Moderate Sedation Time: 88 minutes. The patient's level of consciousness and vital signs were monitored continuously by radiology nursing throughout the procedure under my direct supervision. FLUOROSCOPY TIME:  CT COMPLICATIONS: None PROCEDURE: Informed written consent was obtained from the patient after a thorough discussion of the procedural risks, benefits and alternatives. All questions were addressed. Maximal Sterile Barrier Technique was utilized including caps, mask, sterile gowns, sterile gloves, sterile drape, hand hygiene and skin antiseptic. A timeout was performed prior to the initiation of the procedure. Patient positioned supine position on CT gantry table. Scout CT was acquired for planning purposes. The patient is prepped and draped in the usual sterile fashion. 1% lidocaine was used for local anesthesia. 80 gauge guide needle was advanced under CT guidance into the largest of the hypoechoic lesions of the right liver. Once we confirmed needle tip position, multiple 18 gauge core biopsy were acquired. Gel-Foam slurry was then infused through the needle. Needle was removed and a final image was stored. Patient tolerated the procedure well and remained hemodynamically stable throughout. No complications were encountered and no significant blood loss. IMPRESSION: Status post CT-guided biopsy of right liver mass with tissue specimen sent to pathology for complete histopathologic analysis. Signed, Dulcy Fanny. Dellia Nims, RPVI [facility] Radiology Electronically Signed   By: Corrie Mckusick D.O.   On: 02/14/2019 15:19   Vas [place] Lower Extremity Venous (dvt)  Result Date: 02/14/2019  Lower Venous Study Indications: Pulmonary embolism.  Risk Factors: Confirmed PE. Comparison Study: No previous study available Performing  Technologist: Toma Copier RVS  Examination Guidelines: A complete evaluation includes B-mode imaging, spectral Doppler, color Doppler, and power Doppler as needed of all accessible portions of each vessel. Bilateral testing is considered an integral part of a complete examination. Limited examinations for reoccurring indications may be performed as noted.  +---------+---------------+---------+-----------+----------+--------------+ RIGHT    CompressibilityPhasicitySpontaneityPropertiesThrombus Aging +---------+---------------+---------+-----------+----------+--------------+ CFV      Full           Yes      Yes                                 +---------+---------------+---------+-----------+----------+--------------+ SFJ      Full                                                        +---------+---------------+---------+-----------+----------+--------------+ FV Prox  Full           Yes      Yes                                 +---------+---------------+---------+-----------+----------+--------------+ FV Mid   Full                                                        +---------+---------------+---------+-----------+----------+--------------+ FV DistalFull           Yes      Yes                                 +---------+---------------+---------+-----------+----------+--------------+  PFV      Full           Yes      Yes                                 +---------+---------------+---------+-----------+----------+--------------+ POP      Full           Yes      Yes                                 +---------+---------------+---------+-----------+----------+--------------+ PTV      Full                                                        +---------+---------------+---------+-----------+----------+--------------+ PERO     Full                                                         +---------+---------------+---------+-----------+----------+--------------+ Doppler signals somewhat pulsitile  +---------+---------------+---------+-----------+----------+--------------+ LEFT     CompressibilityPhasicitySpontaneityPropertiesThrombus Aging +---------+---------------+---------+-----------+----------+--------------+ CFV      Full           Yes      Yes                                 +---------+---------------+---------+-----------+----------+--------------+ SFJ      Full                                                        +---------+---------------+---------+-----------+----------+--------------+ FV Prox  Full           Yes      Yes                                 +---------+---------------+---------+-----------+----------+--------------+ FV Mid   Full                                                        +---------+---------------+---------+-----------+----------+--------------+ FV DistalFull           Yes      Yes                                 +---------+---------------+---------+-----------+----------+--------------+ PFV      Full           Yes      Yes                                 +---------+---------------+---------+-----------+----------+--------------+ POP  Partial        No       Yes                  Acute          +---------+---------------+---------+-----------+----------+--------------+ PTV      Full                                                        +---------+---------------+---------+-----------+----------+--------------+ PERO     Full                                                        +---------+---------------+---------+-----------+----------+--------------+ Doppler signals somewhat pulsitile    Summary: Right: There is no evidence of deep vein thrombosis in the lower extremity. No cystic structure found in the popliteal fossa. Left: Findings consistent with acute deep vein thrombosis involving  the left popliteal vein. No cystic structure found in the popliteal fossa.  *See table(s) above for measurements and observations. Electronically signed by Servando Snare MD on 02/14/2019 at 1:14:39 PM.    Final        Subjective:  Patient seen and examined the bedside this morning.  Hemodynamically stable for discharge.  Discussed with wife on phone about the further plan.  Discharge Exam: Vitals:   02/14/19 2027 02/15/19 0610  BP: (!) 147/100 (!) 145/106  Pulse: 90 91  Resp:    Temp: 98.3 F (36.8 C) 98.4 F (36.9 C)  SpO2: 93% 95%   Vitals:   02/14/19 1740 02/14/19 1745 02/14/19 2027 02/15/19 0610  BP: (!) 141/102 (!) 141/102 (!) 147/100 (!) 145/106  Pulse: 92 91 90 91  Resp: (!) 21     Temp: 98.2 F (36.8 C)  98.3 F (36.8 C) 98.4 F (36.9 C)  TempSrc: Oral  Oral Oral  SpO2: 93% 93% 93% 95%  Weight:    109.7 kg  Height:        General: Pt is alert, awake, not in acute distress Cardiovascular: RRR, S1/S2 +, no rubs, no gallops Respiratory: CTA bilaterally, no wheezing, no rhonchi Abdominal: Soft, NT, ND, bowel sounds + Extremities: no edema, no cyanosis    The results of significant diagnostics from this hospitalization (including imaging, microbiology, ancillary and laboratory) are listed below for reference.     Microbiology: Recent Results (from the past 240 hour(s))  SARS Coronavirus 2 [facility] order, Performed in [facility] hospital lab) Nasopharyngeal Nasopharyngeal Swab     Status: None   Collection Time: 02/12/19  1:23 PM   Specimen: Nasopharyngeal Swab  Result Value Ref Range Status   SARS Coronavirus 2 NEGATIVE NEGATIVE Final    Comment: (NOTE) If result is NEGATIVE SARS-CoV-2 target nucleic acids are NOT DETECTED. The SARS-CoV-2 RNA is generally detectable in upper and lower  respiratory specimens during the acute phase of infection. The lowest  concentration of SARS-CoV-2 viral copies this assay can detect is 250  copies / mL. A negative result  does not preclude SARS-CoV-2 infection  and should not be used as the sole basis for treatment or other  patient management decisions.  A negative result may occur with  improper specimen  collection / handling, submission of specimen other  than nasopharyngeal swab, presence of viral mutation(s) within the  areas targeted by this assay, and inadequate number of viral copies  (<250 copies / mL). A negative result must be combined with clinical  observations, patient history, and epidemiological information. If result is POSITIVE SARS-CoV-2 target nucleic acids are DETECTED. The SARS-CoV-2 RNA is generally detectable in upper and lower  respiratory specimens dur ing the acute phase of infection.  Positive  results are indicative of active infection with SARS-CoV-2.  Clinical  correlation with patient history and other diagnostic information is  necessary to determine patient infection status.  Positive results do  not rule out bacterial infection or co-infection with other viruses. If result is PRESUMPTIVE POSTIVE SARS-CoV-2 nucleic acids MAY BE PRESENT.   A presumptive positive result was obtained on the submitted specimen  and confirmed on repeat testing.  While 2019 novel coronavirus  (SARS-CoV-2) nucleic acids may be present in the submitted sample  additional confirmatory testing may be necessary for epidemiological  and / or clinical management purposes  to differentiate between  SARS-CoV-2 and other Sarbecovirus currently known to infect humans.  If clinically indicated additional testing with an alternate test  methodology [phone]) is advised. The SARS-CoV-2 RNA is generally  detectable in upper and lower respiratory sp ecimens during the acute  phase of infection. The expected result is Negative. Fact Sheet for Patients:  StrictlyIdeas.no Fact Sheet for Healthcare Providers: BankingDealers.co.za This test is not yet approved or  cleared by the [place] FDA and has been authorized for detection and/or diagnosis of SARS-CoV-2 by FDA under an Emergency Use Authorization (EUA).  This EUA will remain in effect (meaning this test can be used) for the duration of the COVID-19 declaration under Section 564(b)(1) of the Act, 21 U.S.C. section 360bbb-3(b)(1), unless the authorization is terminated or revoked sooner. Performed at [facility], [place] [address], [place], [place] [address]      Labs: BNP (last 3 results) Recent Labs    02/12/19 1528  BNP [id number]   Basic Metabolic Panel: Recent Labs  Lab 02/12/19 1037 02/13/19 0647  NA 141 139  K 4.2 4.2  CL 107 106  CO2 23 20*  GLUCOSE 124* 111*  BUN 16 16  CREATININE 1.32* 1.22  CALCIUM 9.9 9.4   Liver Function Tests: Recent Labs  Lab 02/15/19 0427  AST 37  ALT 59*  ALKPHOS 47  BILITOT 1.1  PROT 6.1*  ALBUMIN 2.9*   No results for input(s): LIPASE, AMYLASE in the last 168 hours. No results for input(s): AMMONIA in the last 168 hours. CBC: Recent Labs  Lab 02/12/19 1037 02/13/19 0647 02/14/19 0448 02/15/19 0427  WBC 7.6 6.6 5.2 5.4  HGB 16.0 15.6 13.6 13.7  HCT 48.3 45.6 39.7 39.2  MCV 105.9* 103.6* 103.1* 102.1*  PLT 143* 143* 129* 145*   Cardiac Enzymes: No results for input(s): CKTOTAL, CKMB, CKMBINDEX, TROPONINI in the last 168 hours. BNP: Invalid input(s): POCBNP CBG: No results for input(s): GLUCAP in the last 168 hours. D-Dimer No results for input(s): DDIMER in the last 72 hours. Hgb A1c No results for input(s): HGBA1C in the last 72 hours. Lipid Profile No results for input(s): CHOL, HDL, LDLCALC, TRIG, CHOLHDL, LDLDIRECT in the last 72 hours. Thyroid function studies No results for input(s): TSH, T4TOTAL, T3FREE, THYROIDAB in the last 72 hours.  Invalid input(s): FREET3 Anemia work up Recent Labs    02/12/19 [place]  FOLATE 28.6  FERRITIN 1,265*  TIBC 316  IRON 74  RETICCTPCT 1.6    Urinalysis No results found for: COLORURINE, APPEARANCEUR, LABSPEC, PHURINE, GLUCOSEU, HGBUR, BILIRUBINUR, KETONESUR, PROTEINUR, UROBILINOGEN, NITRITE, LEUKOCYTESUR Sepsis Labs Invalid input(s): PROCALCITONIN,  WBC,  LACTICIDVEN Microbiology Recent Results (from the past 240 hour(s))  SARS Coronavirus 2 [facility] order, Performed in [facility] hospital lab) Nasopharyngeal Nasopharyngeal Swab     Status: None   Collection Time: 02/12/19  1:23 PM   Specimen: Nasopharyngeal Swab  Result Value Ref Range Status   SARS Coronavirus 2 NEGATIVE NEGATIVE Final    Comment: (NOTE) If result is NEGATIVE SARS-CoV-2 target nucleic acids are NOT DETECTED. The SARS-CoV-2 RNA is generally detectable in upper and lower  respiratory specimens during the acute phase of infection. The lowest  concentration of SARS-CoV-2 viral copies this assay can detect is 250  copies / mL. A negative result does not preclude SARS-CoV-2 infection  and should not be used as the sole basis for treatment or other  patient management decisions.  A negative result may occur with  improper specimen collection / handling, submission of specimen other  than nasopharyngeal swab, presence of viral mutation(s) within the  areas targeted by this assay, and inadequate number of viral copies  (<250 copies / mL). A negative result must be combined with clinical  observations, patient history, and epidemiological information. If result is POSITIVE SARS-CoV-2 target nucleic acids are DETECTED. The SARS-CoV-2 RNA is generally detectable in upper and lower  respiratory specimens dur ing the acute phase of infection.  Positive  results are indicative of active infection with SARS-CoV-2.  Clinical  correlation with patient history and other diagnostic information is  necessary to determine patient infection status.  Positive results do  not rule out bacterial infection or co-infection with other viruses. If result is PRESUMPTIVE  POSTIVE SARS-CoV-2 nucleic acids MAY BE PRESENT.   A presumptive positive result was obtained on the submitted specimen  and confirmed on repeat testing.  While 2019 novel coronavirus  (SARS-CoV-2) nucleic acids may be present in the submitted sample  additional confirmatory testing may be necessary for epidemiological  and / or clinical management purposes  to differentiate between  SARS-CoV-2 and other Sarbecovirus currently known to infect humans.  If clinically indicated additional testing with an alternate test  methodology [phone]) is advised. The SARS-CoV-2 RNA is generally  detectable in upper and lower respiratory sp ecimens during the acute  phase of infection. The expected result is Negative. Fact Sheet for Patients:  StrictlyIdeas.no Fact Sheet for Healthcare Providers: BankingDealers.co.za This test is not yet approved or cleared by the [place] FDA and has been authorized for detection and/or diagnosis of SARS-CoV-2 by FDA under an Emergency Use Authorization (EUA).  This EUA will remain in effect (meaning this test can be used) for the duration of the COVID-19 declaration under Section 564(b)(1) of the Act, 21 U.S.C. section 360bbb-3(b)(1), unless the authorization is terminated or revoked sooner. Performed at [facility], [place] [address] [place],  [address]     Please note: You were cared for by a hospitalist during your hospital stay. Once you are discharged, your primary care physician will handle any further medical issues. Please note that NO REFILLS for any discharge medications will be authorized once you are discharged, as it is imperative that you return to your primary care physician (or establish a relationship with a primary care physician if you do not have one) for your post  hospital discharge needs so that they can reassess your need for medications and monitor your lab  values.    Time coordinating discharge: 40 minutes  SIGNED:   Shelly Coss, MD  [facility] 02/15/2019, 10:46 AM Pager [phone]  If 7PM-7AM, please contact night-coverage [email or web address] Password [email or web address]

## 2019-02-15 NOTE — Progress Notes (Signed)
Home CPAP. Patient placed CPAP on self

## 2019-02-15 NOTE — Care Management (Signed)
1134 02-15-19 CM provided patient with 30 day free Eliquis Card. CM also discussed open enrollment for Medicare Part D plans for medications. Patient was appreciative of time and information. Bethena Roys, RN,BSN Case Manager [phone]

## 2019-02-15 NOTE — Evaluation (Signed)
Physical Therapy Evaluation Patient Details Name: Kenneth Lang MRN: [id number] DOB: 1951-01-21 Today's Date: 02/15/2019   History of Present Illness  68 y.o. male with history of hypertension, OSA on CPAP, gout and remote history of prostate cancer treated surgically presenting with chest congestion.Presents to ED with tachycardia, CTA revealing acute PE with right heart strain and incidental finding of hepatic metastatic disease. Admitted to [facility] 02/12/19. Found to have L LE DVT. S/p 9/8 liver biopsy  Clinical Impression  Patient evaluated by Physical Therapy with no further acute PT needs identified. Pt is independent in bed mobility and transfers and mod I for increased time with ambulation of 350 feet without AD. Pt is supervision for ascent/descent of 12 steps with use of handrail. Pt scored 23/24 on DGI placing him at reduced risks for falls. Pt educated on short bouts of walking hourly to build back endurance. Patient has no further questions. Pt does not require any follow-up Physical Therapy or equipment needs. PT is signing off. Thank you for this referral.     Follow Up Recommendations No PT follow up;Supervision for mobility/OOB    Equipment Recommendations  None recommended by PT       Precautions / Restrictions Precautions Precautions: None Restrictions Weight Bearing Restrictions: No      Mobility  Bed Mobility Overal bed mobility: Independent                Transfers Overall transfer level: Independent               General transfer comment: good power up and steadying  Ambulation/Gait Ambulation/Gait assistance: Modified independent (Device/Increase time) Gait Distance (Feet): 350 Feet Assistive device: None Gait Pattern/deviations: Step-through pattern;Decreased step length - right;Decreased step length - left;Drifts right/left Gait velocity: slowed Gait velocity interpretation: 1.31 - 2.62 ft/sec, indicative of limited community  ambulator General Gait Details: slow, initially mildly unsteady progressing in stability, no overt LoB, velocity also increased with distance  Stairs Stairs: Yes Stairs assistance: Supervision Stair Management: One rail Left;One rail Right;Forwards;Alternating pattern Number of Stairs: 12 General stair comments: slow, steady ascent/descent, 2/4 DoE, SaO2 on RA 91%O2, HR 112bpm      Balance Overall balance assessment: Modified Independent                               Standardized Balance Assessment Standardized Balance Assessment : Dynamic Gait Index   Dynamic Gait Index Level Surface: Normal Change in Gait Speed: Normal Gait with Horizontal Head Turns: Normal Gait with Vertical Head Turns: Normal Gait and Pivot Turn: Normal Step Over Obstacle: Normal Step Around Obstacles: Normal Steps: Mild Impairment Total Score: 23       Pertinent Vitals/Pain Pain Assessment: 0-10 Pain Score: 3  Pain Location: biopsy site  Pain Descriptors / Indicators: Aching Pain Intervention(s): Limited activity within patient's tolerance;Monitored during session;Repositioned    Home Living Family/patient expects to be discharged to:: Private residence Living Arrangements: Spouse/significant other Available Help at Discharge: Family;Available 24 hours/day Type of Home: House Home Access: Stairs to enter   [employer] of Steps: 3 Home Layout: Two level;Bed/bath upstairs Home Equipment: Walker - 2 wheels;Cane - single point;Crutches;Grab bars - tub/shower      Prior Function Level of Independence: Independent         Comments: drives, mows lawn         Extremity/Trunk Assessment   Upper Extremity Assessment Upper Extremity Assessment: Overall WFL for tasks  assessed    Lower Extremity Assessment Lower Extremity Assessment: Overall WFL for tasks assessed    Cervical / Trunk Assessment Cervical / Trunk Assessment: Normal  Communication    Communication: No difficulties  Cognition Arousal/Alertness: Awake/alert Behavior During Therapy: WFL for tasks assessed/performed Overall Cognitive Status: Within Functional Limits for tasks assessed                                        General Comments General comments (skin integrity, edema, etc.): VSS         Assessment/Plan    PT Assessment Patent does not need any further PT services         PT Goals (Current goals can be found in the Care Plan section)  Acute Rehab PT Goals Patient Stated Goal: go home and get back to mowing lawn PT Goal Formulation: All assessment and education complete, DC therapy     AM-PAC PT "6 Clicks" Mobility  Outcome Measure Help needed turning from your back to your side while in a flat bed without using bedrails?: None Help needed moving from lying on your back to sitting on the side of a flat bed without using bedrails?: None Help needed moving to and from a bed to a chair (including a wheelchair)?: None Help needed standing up from a chair using your arms (e.g., wheelchair or bedside chair)?: None Help needed to walk in hospital room?: None Help needed climbing 3-5 steps with a railing? : None 6 Click Score: 24    End of Session Equipment Utilized During Treatment: Gait belt Activity Tolerance: Patient tolerated treatment well Patient left: Other (comment)(left at sink bruching teeth) Nurse Communication: Mobility status PT Visit Diagnosis: Unsteadiness on feet (R26.81)    Time: [id number] PT Time Calculation (min) (ACUTE ONLY): 20 min   Charges:   PT Evaluation $PT Eval Moderate Complexity: 1 Mod          Brin Ruggerio B. Migdalia Dk PT, DPT Acute Rehabilitation Services Pager [phone] Office [phone]   [place] 02/15/2019, 9:56 AM

## 2019-02-15 NOTE — Care Management Important Message (Signed)
Important Message  Patient Details  Name: Kenneth Lang MRN: [id number] Date of Birth: [birth year]   Medicare Important Message Given:  Yes     Shelda Altes 02/15/2019, 12:15 PM

## 2019-02-17 DIAGNOSIS — Z86711 Personal history of pulmonary embolism: Secondary | ICD-10-CM | POA: Diagnosis not present

## 2019-02-17 DIAGNOSIS — K769 Liver disease, unspecified: Secondary | ICD-10-CM | POA: Diagnosis not present

## 2019-02-20 ENCOUNTER — Other Ambulatory Visit: Payer: Self-pay | Admitting: Geriatric Medicine

## 2019-02-20 DIAGNOSIS — K769 Liver disease, unspecified: Secondary | ICD-10-CM

## 2019-02-27 ENCOUNTER — Ambulatory Visit
Admission: RE | Admit: 2019-02-27 | Discharge: 2019-02-27 | Disposition: A | Discharge status: Home / Self Care | Payer: PPO | Source: Ambulatory Visit | Attending: Geriatric Medicine | Admitting: Geriatric Medicine

## 2019-02-27 DIAGNOSIS — K760 Fatty (change of) liver, not elsewhere classified: Secondary | ICD-10-CM | POA: Diagnosis not present

## 2019-02-27 DIAGNOSIS — K769 Liver disease, unspecified: Secondary | ICD-10-CM

## 2019-02-27 DIAGNOSIS — K7689 Other specified diseases of liver: Secondary | ICD-10-CM | POA: Diagnosis not present

## 2019-02-27 DIAGNOSIS — K5710 Diverticulosis of small intestine without perforation or abscess without bleeding: Secondary | ICD-10-CM | POA: Diagnosis not present

## 2019-02-27 MED ORDER — GADOBENATE DIMEGLUMINE 529 MG/ML IV SOLN
20.0000 mL | Freq: Once | INTRAVENOUS | Status: AC | PRN
  Administered 2019-02-27: 20 mL via INTRAVENOUS

## 2019-02-28 DIAGNOSIS — G4733 Obstructive sleep apnea (adult) (pediatric): Secondary | ICD-10-CM | POA: Diagnosis not present

## 2019-03-01 ENCOUNTER — Other Ambulatory Visit: Payer: PPO

## 2019-03-03 DIAGNOSIS — G4733 Obstructive sleep apnea (adult) (pediatric): Secondary | ICD-10-CM | POA: Diagnosis not present

## 2019-03-07 DIAGNOSIS — I10 Essential (primary) hypertension: Secondary | ICD-10-CM | POA: Diagnosis not present

## 2019-03-07 DIAGNOSIS — Z8546 Personal history of malignant neoplasm of prostate: Secondary | ICD-10-CM | POA: Diagnosis not present

## 2019-03-13 DIAGNOSIS — H2513 Age-related nuclear cataract, bilateral: Secondary | ICD-10-CM | POA: Diagnosis not present

## 2019-03-13 DIAGNOSIS — H18413 Arcus senilis, bilateral: Secondary | ICD-10-CM | POA: Diagnosis not present

## 2019-03-13 DIAGNOSIS — H11823 Conjunctivochalasis, bilateral: Secondary | ICD-10-CM | POA: Diagnosis not present

## 2019-03-13 DIAGNOSIS — H0288A Meibomian gland dysfunction right eye, upper and lower eyelids: Secondary | ICD-10-CM | POA: Diagnosis not present

## 2019-03-13 DIAGNOSIS — H0102A Squamous blepharitis right eye, upper and lower eyelids: Secondary | ICD-10-CM | POA: Diagnosis not present

## 2019-03-13 DIAGNOSIS — H40012 Open angle with borderline findings, low risk, left eye: Secondary | ICD-10-CM | POA: Diagnosis not present

## 2019-03-13 DIAGNOSIS — H0102B Squamous blepharitis left eye, upper and lower eyelids: Secondary | ICD-10-CM | POA: Diagnosis not present

## 2019-03-13 DIAGNOSIS — H43823 Vitreomacular adhesion, bilateral: Secondary | ICD-10-CM | POA: Diagnosis not present

## 2019-03-13 DIAGNOSIS — H02831 Dermatochalasis of right upper eyelid: Secondary | ICD-10-CM | POA: Diagnosis not present

## 2019-03-13 DIAGNOSIS — H25013 Cortical age-related cataract, bilateral: Secondary | ICD-10-CM | POA: Diagnosis not present

## 2019-03-13 DIAGNOSIS — H02834 Dermatochalasis of left upper eyelid: Secondary | ICD-10-CM | POA: Diagnosis not present

## 2019-03-13 DIAGNOSIS — H0288B Meibomian gland dysfunction left eye, upper and lower eyelids: Secondary | ICD-10-CM | POA: Diagnosis not present

## 2019-03-16 ENCOUNTER — Encounter (INDEPENDENT_AMBULATORY_CARE_PROVIDER_SITE_OTHER): Payer: PPO | Admitting: Ophthalmology

## 2019-03-16 ENCOUNTER — Other Ambulatory Visit: Payer: Self-pay

## 2019-03-16 DIAGNOSIS — H2513 Age-related nuclear cataract, bilateral: Secondary | ICD-10-CM

## 2019-03-16 DIAGNOSIS — I10 Essential (primary) hypertension: Secondary | ICD-10-CM

## 2019-03-16 DIAGNOSIS — H35033 Hypertensive retinopathy, bilateral: Secondary | ICD-10-CM

## 2019-03-16 DIAGNOSIS — H43813 Vitreous degeneration, bilateral: Secondary | ICD-10-CM | POA: Diagnosis not present

## 2019-03-21 DIAGNOSIS — Z8546 Personal history of malignant neoplasm of prostate: Secondary | ICD-10-CM | POA: Diagnosis not present

## 2019-03-21 DIAGNOSIS — I10 Essential (primary) hypertension: Secondary | ICD-10-CM | POA: Diagnosis not present

## 2019-03-21 DIAGNOSIS — E7800 Pure hypercholesterolemia, unspecified: Secondary | ICD-10-CM | POA: Diagnosis not present

## 2019-03-28 DIAGNOSIS — L218 Other seborrheic dermatitis: Secondary | ICD-10-CM | POA: Diagnosis not present

## 2019-03-28 DIAGNOSIS — L570 Actinic keratosis: Secondary | ICD-10-CM | POA: Diagnosis not present

## 2019-03-28 DIAGNOSIS — L820 Inflamed seborrheic keratosis: Secondary | ICD-10-CM | POA: Diagnosis not present

## 2019-03-30 DIAGNOSIS — G4733 Obstructive sleep apnea (adult) (pediatric): Secondary | ICD-10-CM | POA: Diagnosis not present

## 2019-04-18 DIAGNOSIS — I82592 Chronic embolism and thrombosis of other specified deep vein of left lower extremity: Secondary | ICD-10-CM | POA: Diagnosis not present

## 2019-04-18 DIAGNOSIS — K760 Fatty (change of) liver, not elsewhere classified: Secondary | ICD-10-CM | POA: Diagnosis not present

## 2019-04-18 DIAGNOSIS — I10 Essential (primary) hypertension: Secondary | ICD-10-CM | POA: Diagnosis not present

## 2019-04-24 DIAGNOSIS — I10 Essential (primary) hypertension: Secondary | ICD-10-CM | POA: Diagnosis not present

## 2019-04-24 DIAGNOSIS — Z8546 Personal history of malignant neoplasm of prostate: Secondary | ICD-10-CM | POA: Diagnosis not present

## 2019-04-24 DIAGNOSIS — E7800 Pure hypercholesterolemia, unspecified: Secondary | ICD-10-CM | POA: Diagnosis not present

## 2019-04-30 DIAGNOSIS — G4733 Obstructive sleep apnea (adult) (pediatric): Secondary | ICD-10-CM | POA: Diagnosis not present

## 2019-05-01 DIAGNOSIS — G4733 Obstructive sleep apnea (adult) (pediatric): Secondary | ICD-10-CM | POA: Diagnosis not present

## 2019-05-16 DIAGNOSIS — I10 Essential (primary) hypertension: Secondary | ICD-10-CM | POA: Diagnosis not present

## 2019-05-16 DIAGNOSIS — Z8546 Personal history of malignant neoplasm of prostate: Secondary | ICD-10-CM | POA: Diagnosis not present

## 2019-05-16 DIAGNOSIS — E7800 Pure hypercholesterolemia, unspecified: Secondary | ICD-10-CM | POA: Diagnosis not present

## 2019-05-30 DIAGNOSIS — G4733 Obstructive sleep apnea (adult) (pediatric): Secondary | ICD-10-CM | POA: Diagnosis not present

## 2019-06-15 DIAGNOSIS — I10 Essential (primary) hypertension: Secondary | ICD-10-CM | POA: Diagnosis not present

## 2019-06-15 DIAGNOSIS — Z8546 Personal history of malignant neoplasm of prostate: Secondary | ICD-10-CM | POA: Diagnosis not present

## 2019-06-15 DIAGNOSIS — E7800 Pure hypercholesterolemia, unspecified: Secondary | ICD-10-CM | POA: Diagnosis not present

## 2019-06-28 ENCOUNTER — Ambulatory Visit: Payer: PPO | Attending: Internal Medicine

## 2019-06-28 DIAGNOSIS — Z23 Encounter for immunization: Secondary | ICD-10-CM | POA: Insufficient documentation

## 2019-06-28 NOTE — Progress Notes (Signed)
   Covid-19 Vaccination Clinic  Name:  Kenneth Lang    MRN: [id number] DOB: 10/07/1950  06/28/2019  [name] was observed post Covid-19 immunization for 15 minutes without incidence. He was provided with Vaccine Information Sheet and instruction to access the V-Safe system.   [name] was instructed to call 911 with any severe reactions post vaccine: Marland Kitchen Difficulty breathing  . Swelling of your face and throat  . A fast heartbeat  . A bad rash all over your body  . Dizziness and weakness    Immunizations Administered    Name Date Dose VIS Date Route   Pfizer COVID-19 Vaccine 06/28/2019  6:34 PM 0.3 mL 05/19/2019 Intramuscular   Manufacturer: [place]   Lot: [id number]   [place]: [id number]

## 2019-06-30 DIAGNOSIS — G4733 Obstructive sleep apnea (adult) (pediatric): Secondary | ICD-10-CM | POA: Diagnosis not present

## 2019-07-07 ENCOUNTER — Other Ambulatory Visit: Payer: Self-pay | Admitting: Geriatric Medicine

## 2019-07-07 DIAGNOSIS — K769 Liver disease, unspecified: Secondary | ICD-10-CM

## 2019-07-17 ENCOUNTER — Ambulatory Visit: Payer: PPO | Attending: Internal Medicine

## 2019-07-17 DIAGNOSIS — Z23 Encounter for immunization: Secondary | ICD-10-CM | POA: Insufficient documentation

## 2019-07-17 NOTE — Progress Notes (Signed)
   Covid-19 Vaccination Clinic  Name:  Kenneth Lang    MRN: [id number] DOB: January 11, 1951  07/17/2019  Kenneth Lang was observed post Covid-19 immunization for 15 minutes without incidence. He was provided with Vaccine Information Sheet and instruction to access the V-Safe system.   Kenneth Lang was instructed to call 911 with any severe reactions post vaccine: Marland Kitchen Difficulty breathing  . Swelling of your face and throat  . A fast heartbeat  . A bad rash all over your body  . Dizziness and weakness    Immunizations Administered    Name Date Dose VIS Date Route   Pfizer COVID-19 Vaccine 07/17/2019 11:38 AM 0.3 mL 05/19/2019 Intramuscular   Manufacturer: [place]   Lot: [id number]   [place]: [id number]

## 2019-07-26 ENCOUNTER — Other Ambulatory Visit: Payer: Self-pay

## 2019-07-26 ENCOUNTER — Ambulatory Visit
Admission: RE | Admit: 2019-07-26 | Discharge: 2019-07-26 | Disposition: A | Discharge status: Home / Self Care | Payer: PPO | Source: Ambulatory Visit | Attending: Geriatric Medicine | Admitting: Geriatric Medicine

## 2019-07-26 DIAGNOSIS — K7689 Other specified diseases of liver: Secondary | ICD-10-CM | POA: Diagnosis not present

## 2019-07-26 DIAGNOSIS — K769 Liver disease, unspecified: Secondary | ICD-10-CM

## 2019-07-26 MED ORDER — GADOBENATE DIMEGLUMINE 529 MG/ML IV SOLN
20.0000 mL | Freq: Once | INTRAVENOUS | Status: AC | PRN
  Administered 2019-07-26: 20 mL via INTRAVENOUS

## 2019-07-31 DIAGNOSIS — G4733 Obstructive sleep apnea (adult) (pediatric): Secondary | ICD-10-CM | POA: Diagnosis not present

## 2019-08-17 DIAGNOSIS — I10 Essential (primary) hypertension: Secondary | ICD-10-CM | POA: Diagnosis not present

## 2019-08-17 DIAGNOSIS — Z86711 Personal history of pulmonary embolism: Secondary | ICD-10-CM | POA: Diagnosis not present

## 2019-08-17 DIAGNOSIS — Z86718 Personal history of other venous thrombosis and embolism: Secondary | ICD-10-CM | POA: Diagnosis not present

## 2019-08-18 DIAGNOSIS — J309 Allergic rhinitis, unspecified: Secondary | ICD-10-CM | POA: Diagnosis not present

## 2019-08-18 DIAGNOSIS — J300 Vasomotor rhinitis: Secondary | ICD-10-CM | POA: Diagnosis not present

## 2019-08-18 DIAGNOSIS — R05 Cough: Secondary | ICD-10-CM | POA: Diagnosis not present

## 2019-08-28 DIAGNOSIS — G4733 Obstructive sleep apnea (adult) (pediatric): Secondary | ICD-10-CM | POA: Diagnosis not present

## 2019-08-31 DIAGNOSIS — G4733 Obstructive sleep apnea (adult) (pediatric): Secondary | ICD-10-CM | POA: Diagnosis not present

## 2019-09-06 DIAGNOSIS — E7800 Pure hypercholesterolemia, unspecified: Secondary | ICD-10-CM | POA: Diagnosis not present

## 2019-09-06 DIAGNOSIS — I10 Essential (primary) hypertension: Secondary | ICD-10-CM | POA: Diagnosis not present

## 2019-09-06 DIAGNOSIS — Z8546 Personal history of malignant neoplasm of prostate: Secondary | ICD-10-CM | POA: Diagnosis not present

## 2019-09-14 ENCOUNTER — Encounter (INDEPENDENT_AMBULATORY_CARE_PROVIDER_SITE_OTHER): Payer: PPO | Admitting: Ophthalmology

## 2019-09-14 DIAGNOSIS — I10 Essential (primary) hypertension: Secondary | ICD-10-CM

## 2019-09-14 DIAGNOSIS — H35033 Hypertensive retinopathy, bilateral: Secondary | ICD-10-CM | POA: Diagnosis not present

## 2019-09-14 DIAGNOSIS — H34823 Venous engorgement, bilateral: Secondary | ICD-10-CM

## 2019-09-14 DIAGNOSIS — H35342 Macular cyst, hole, or pseudohole, left eye: Secondary | ICD-10-CM | POA: Diagnosis not present

## 2019-09-14 DIAGNOSIS — H2513 Age-related nuclear cataract, bilateral: Secondary | ICD-10-CM | POA: Diagnosis not present

## 2019-09-14 DIAGNOSIS — H43813 Vitreous degeneration, bilateral: Secondary | ICD-10-CM

## 2019-09-28 DIAGNOSIS — G4733 Obstructive sleep apnea (adult) (pediatric): Secondary | ICD-10-CM | POA: Diagnosis not present

## 2019-10-11 DIAGNOSIS — M1712 Unilateral primary osteoarthritis, left knee: Secondary | ICD-10-CM | POA: Diagnosis not present

## 2019-10-11 DIAGNOSIS — M25562 Pain in left knee: Secondary | ICD-10-CM | POA: Diagnosis not present

## 2019-10-17 DIAGNOSIS — I872 Venous insufficiency (chronic) (peripheral): Secondary | ICD-10-CM | POA: Diagnosis not present

## 2019-10-17 DIAGNOSIS — I10 Essential (primary) hypertension: Secondary | ICD-10-CM | POA: Diagnosis not present

## 2019-10-24 IMAGING — CT CT ABD-PELV W/ CM
2 of 5 series · 16 of 46 positions shown, 18 images · IV contrast (Omni 300)
Comparison: Chest CTA on 02/12/2019

CLINICAL DATA: Hepatic masses on recent chest CT. Personal history
of prostate carcinoma.

EXAM:
CT ABDOMEN AND PELVIS WITH CONTRAST
TECHNIQUE: Multidetector CT imaging of the abdomen and pelvis was performed
using the standard protocol following bolus administration of
intravenous contrast.
CONTRAST:  100mL OMNIPAQUE IOHEXOL 300 MG/ML  SOLN

[Series 3: a/p w/ 5mm · axial · 0.98mm/px · z∈[+331,+811]mm · 13 of 108 slices shown, 15 images]
[im 6/108  soft-tissue]
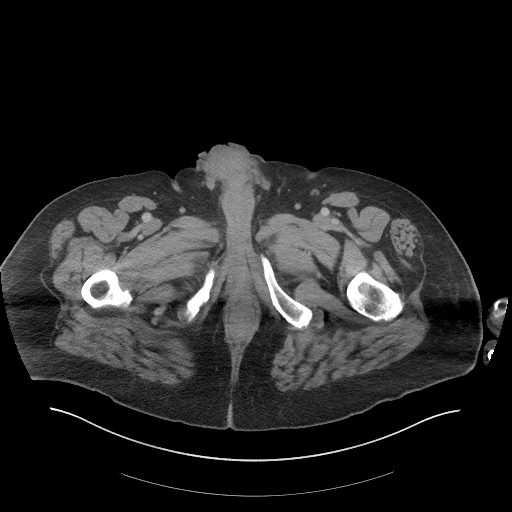
[im 6/108  bone]
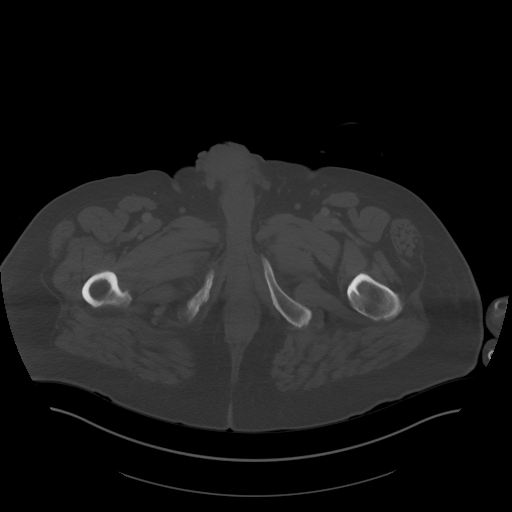
[im 17/108  soft-tissue]
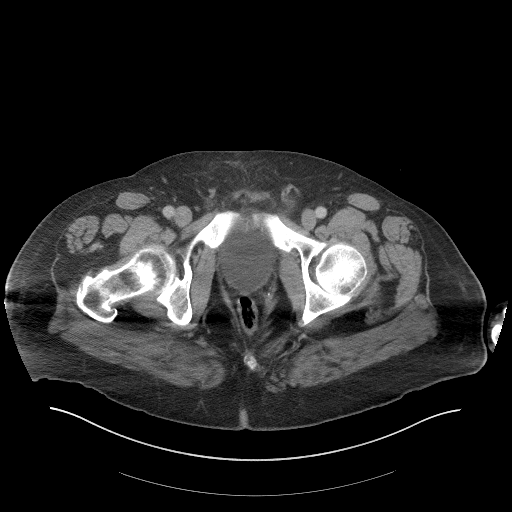
[im 22/108  soft-tissue]
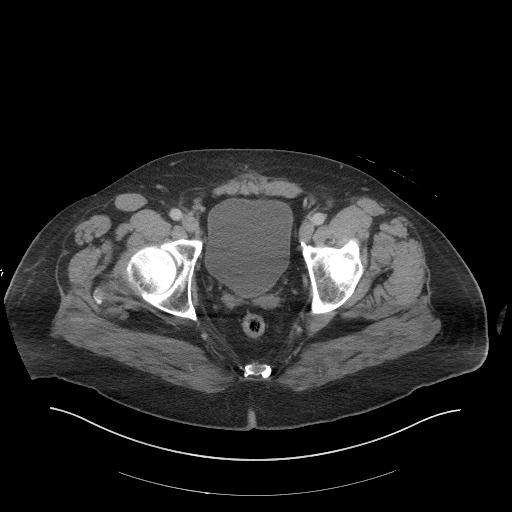
[im 33/108  soft-tissue]
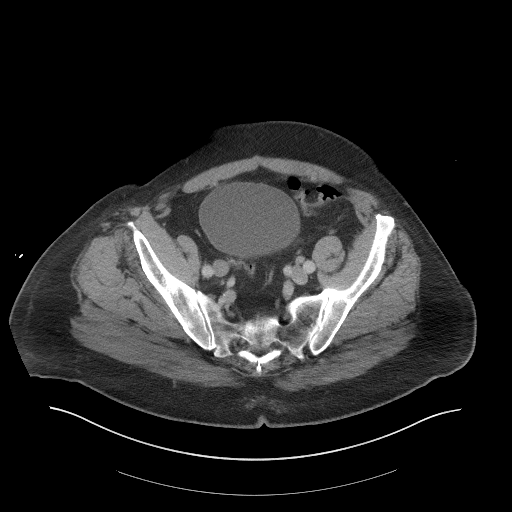
[im 38/108  soft-tissue]
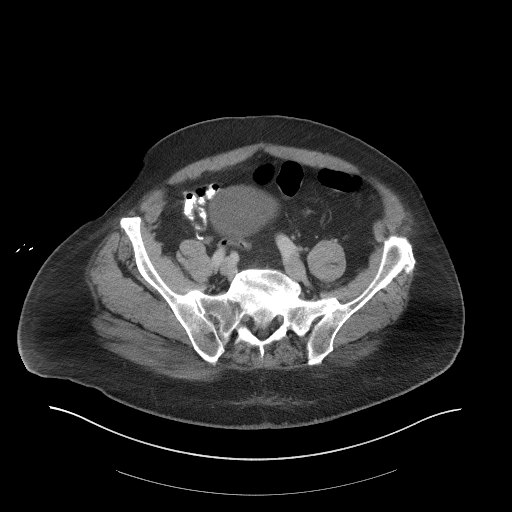
[im 49/108  soft-tissue]
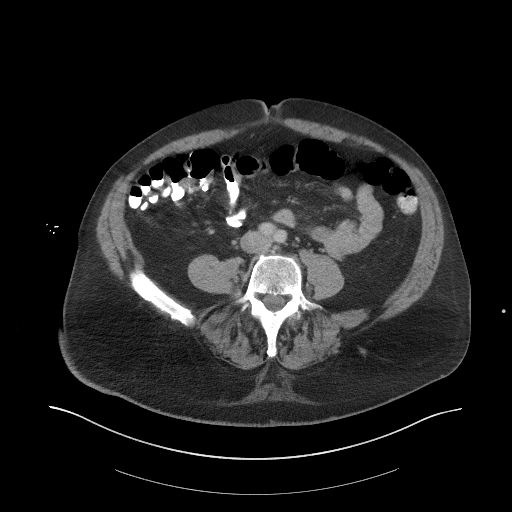
[im 54/108  soft-tissue]
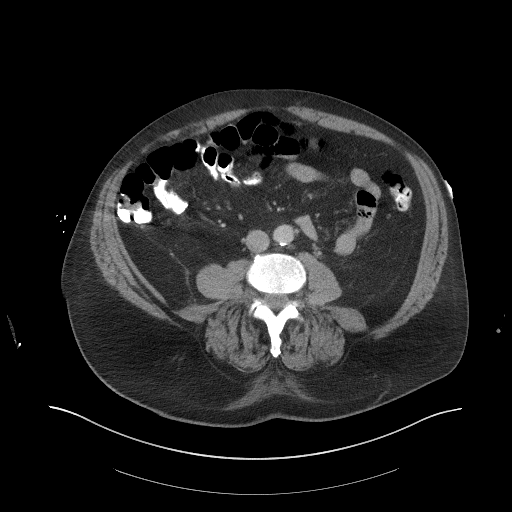
[im 59/108  soft-tissue]
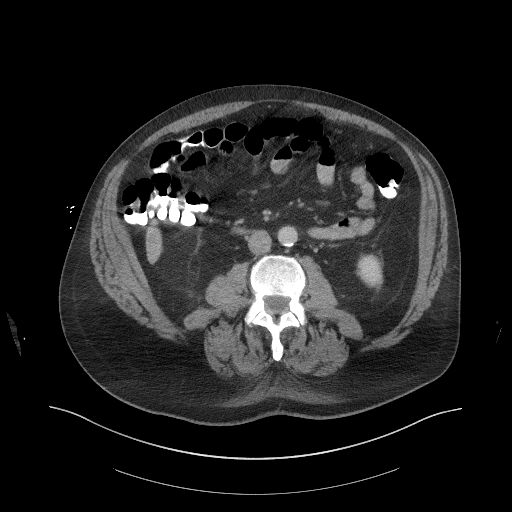
[im 70/108  soft-tissue]
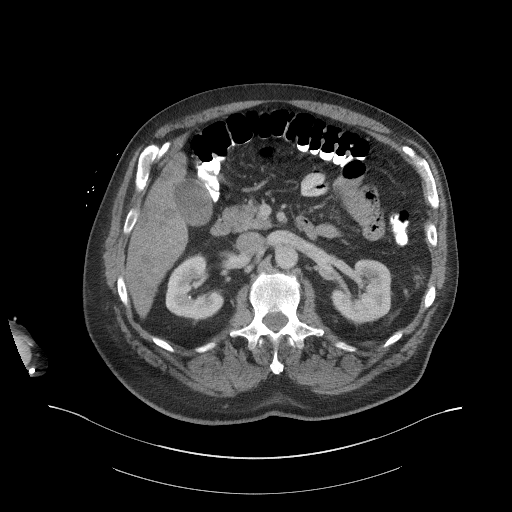
[im 70/108  bone]
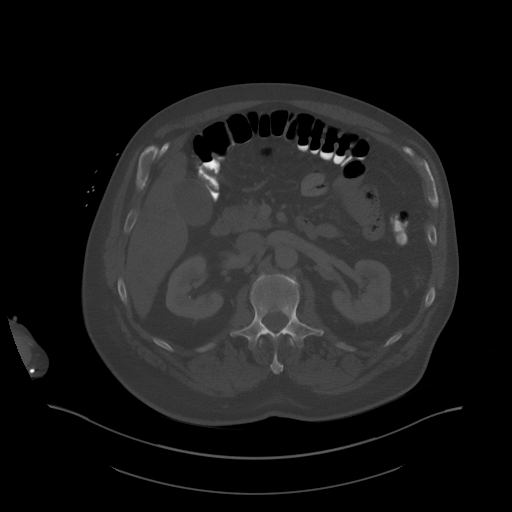
[im 75/108  soft-tissue]
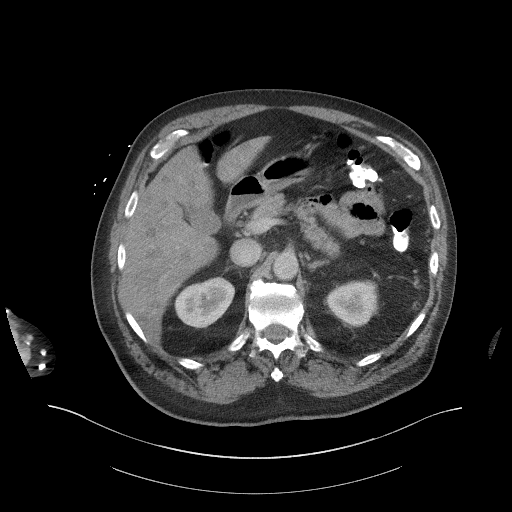
[im 86/108  soft-tissue]
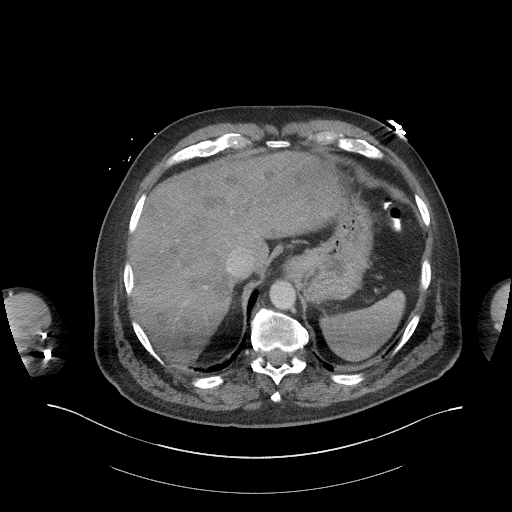
[im 91/108  soft-tissue]
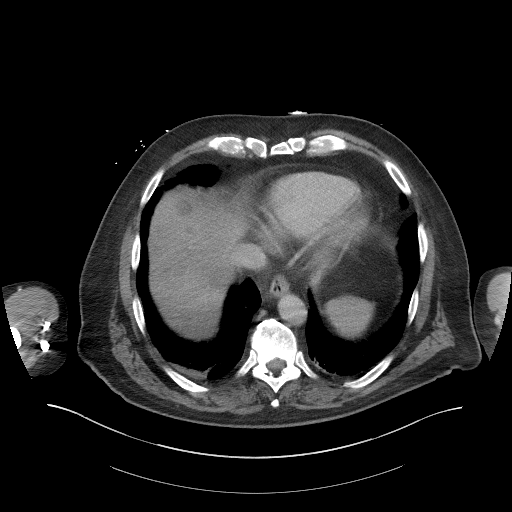
[im 102/108  soft-tissue]
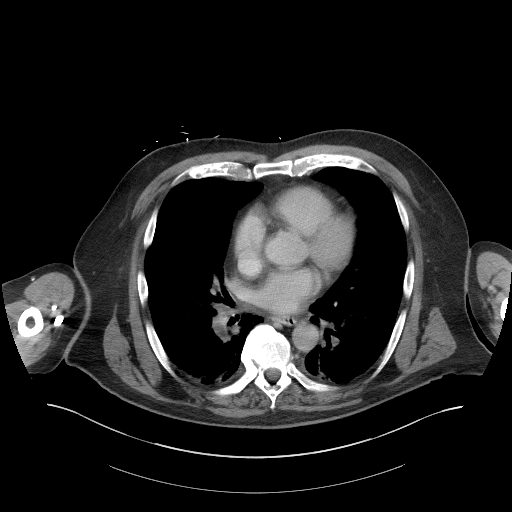

[Series 6: a/p w/ cor · coronal · 0.89mm/px · 3 of 173 slices shown]
[im 58/173  soft-tissue]
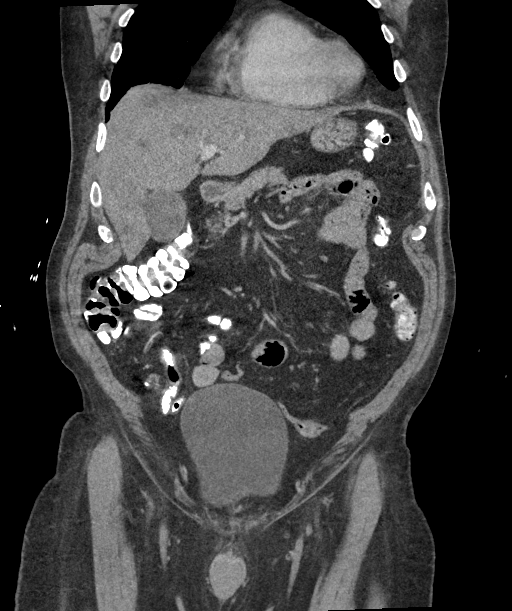
[im 77/173  soft-tissue]
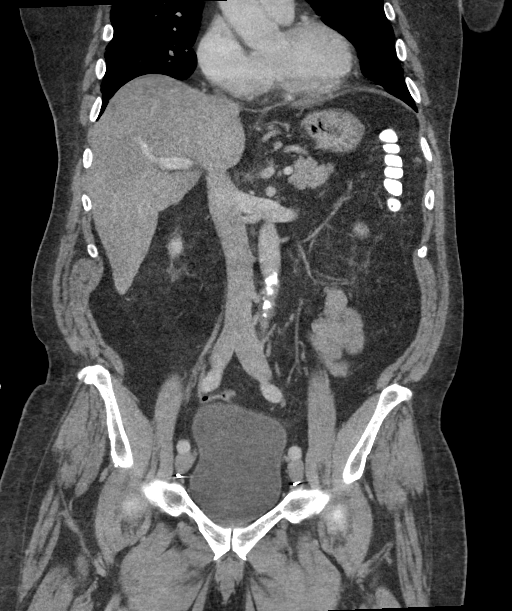
[im 96/173  soft-tissue]
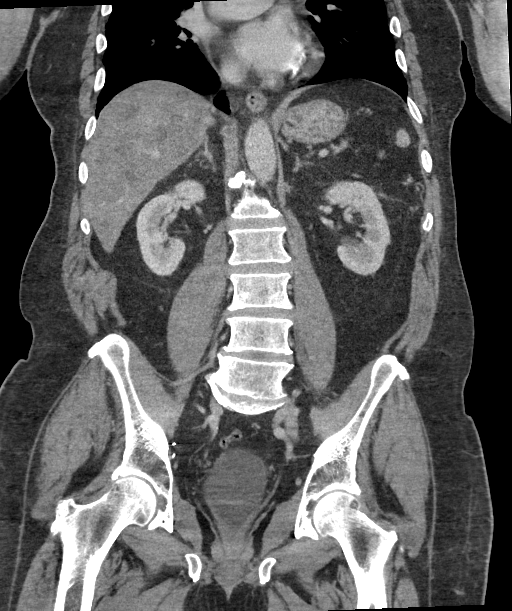

[16 of 46 positions shown; findings below may reference images not displayed]

FINDINGS: Lower Chest: Bilateral lower lobe pulmonary emboli, as demonstrated
on chest CTA performed yesterday. Bibasilar scarring.

Hepatobiliary: Numerous small hypo vascular masses are seen
throughout the right and left lobes, consistent with diffuse liver
metastases. The largest index lesion near the junction of the right
and left lobes measures 3.9 x 2.0 cm on image [DATE]. Gallbladder is
unremarkable. No evidence of biliary ductal dilatation. No evidence
of portal or hepatic vein thrombosis.

Pancreas:  No mass or inflammatory changes.

Spleen: Within normal limits in size and appearance.

Adrenals/Urinary Tract: No masses identified. A few tiny 2-3 mm
right renal calculi are identified, however there is no evidence of
ureteral calculi or hydronephrosis. Unremarkable unopacified urinary
bladder.

Stomach/Bowel: No evidence of obstruction, inflammatory process or
abnormal fluid collections. Normal appendix visualized.

Vascular/Lymphatic: No pathologically enlarged lymph nodes. No
abdominal aortic aneurysm. Aortic atherosclerosis.

Reproductive: Postop changes from previous prostatectomy and
bilateral iliac lymph node dissection.

Other:  None.

Musculoskeletal:  No suspicious bone lesions identified.
IMPRESSION: 1. Diffuse liver metastases.
2. No other sites of primary or metastatic carcinoma identified
within the abdomen or pelvis.
3. Tiny nonobstructing right renal calculi.
4. Previous prostatectomy and bilateral iliac lymph node dissection.

Aortic Atherosclerosis ([id number]).

## 2019-10-28 DIAGNOSIS — G4733 Obstructive sleep apnea (adult) (pediatric): Secondary | ICD-10-CM | POA: Diagnosis not present

## 2019-12-01 DIAGNOSIS — R05 Cough: Secondary | ICD-10-CM | POA: Diagnosis not present

## 2019-12-01 DIAGNOSIS — J300 Vasomotor rhinitis: Secondary | ICD-10-CM | POA: Diagnosis not present

## 2019-12-06 DIAGNOSIS — I10 Essential (primary) hypertension: Secondary | ICD-10-CM | POA: Diagnosis not present

## 2019-12-06 DIAGNOSIS — Z8546 Personal history of malignant neoplasm of prostate: Secondary | ICD-10-CM | POA: Diagnosis not present

## 2019-12-06 DIAGNOSIS — E7800 Pure hypercholesterolemia, unspecified: Secondary | ICD-10-CM | POA: Diagnosis not present

## 2019-12-26 DIAGNOSIS — Z0000 Encounter for general adult medical examination without abnormal findings: Secondary | ICD-10-CM | POA: Diagnosis not present

## 2019-12-26 DIAGNOSIS — H6120 Impacted cerumen, unspecified ear: Secondary | ICD-10-CM | POA: Diagnosis not present

## 2019-12-26 DIAGNOSIS — I700 Atherosclerosis of aorta: Secondary | ICD-10-CM | POA: Diagnosis not present

## 2019-12-26 DIAGNOSIS — K760 Fatty (change of) liver, not elsewhere classified: Secondary | ICD-10-CM | POA: Diagnosis not present

## 2019-12-26 DIAGNOSIS — E7800 Pure hypercholesterolemia, unspecified: Secondary | ICD-10-CM | POA: Diagnosis not present

## 2019-12-26 DIAGNOSIS — G4733 Obstructive sleep apnea (adult) (pediatric): Secondary | ICD-10-CM | POA: Diagnosis not present

## 2019-12-26 DIAGNOSIS — I10 Essential (primary) hypertension: Secondary | ICD-10-CM | POA: Diagnosis not present

## 2019-12-26 DIAGNOSIS — Z79899 Other long term (current) drug therapy: Secondary | ICD-10-CM | POA: Diagnosis not present

## 2019-12-26 DIAGNOSIS — Z1389 Encounter for screening for other disorder: Secondary | ICD-10-CM | POA: Diagnosis not present

## 2020-01-15 DIAGNOSIS — G4733 Obstructive sleep apnea (adult) (pediatric): Secondary | ICD-10-CM | POA: Diagnosis not present

## 2020-01-29 DIAGNOSIS — B356 Tinea cruris: Secondary | ICD-10-CM | POA: Diagnosis not present

## 2020-02-02 DIAGNOSIS — G4733 Obstructive sleep apnea (adult) (pediatric): Secondary | ICD-10-CM | POA: Diagnosis not present

## 2020-02-15 DIAGNOSIS — Z86010 Personal history of colonic polyps: Secondary | ICD-10-CM | POA: Diagnosis not present

## 2020-02-15 DIAGNOSIS — Z7901 Long term (current) use of anticoagulants: Secondary | ICD-10-CM | POA: Diagnosis not present

## 2020-03-04 DIAGNOSIS — E7800 Pure hypercholesterolemia, unspecified: Secondary | ICD-10-CM | POA: Diagnosis not present

## 2020-03-04 DIAGNOSIS — I10 Essential (primary) hypertension: Secondary | ICD-10-CM | POA: Diagnosis not present

## 2020-03-25 DIAGNOSIS — E7800 Pure hypercholesterolemia, unspecified: Secondary | ICD-10-CM | POA: Diagnosis not present

## 2020-03-25 DIAGNOSIS — I10 Essential (primary) hypertension: Secondary | ICD-10-CM | POA: Diagnosis not present

## 2020-03-28 DIAGNOSIS — Z1159 Encounter for screening for other viral diseases: Secondary | ICD-10-CM | POA: Diagnosis not present

## 2020-04-02 DIAGNOSIS — Z86010 Personal history of colonic polyps: Secondary | ICD-10-CM | POA: Diagnosis not present

## 2020-04-02 DIAGNOSIS — K648 Other hemorrhoids: Secondary | ICD-10-CM | POA: Diagnosis not present

## 2020-04-02 DIAGNOSIS — K5730 Diverticulosis of large intestine without perforation or abscess without bleeding: Secondary | ICD-10-CM | POA: Diagnosis not present

## 2020-04-02 DIAGNOSIS — K635 Polyp of colon: Secondary | ICD-10-CM | POA: Diagnosis not present

## 2020-04-02 DIAGNOSIS — D123 Benign neoplasm of transverse colon: Secondary | ICD-10-CM | POA: Diagnosis not present

## 2020-04-05 DIAGNOSIS — K635 Polyp of colon: Secondary | ICD-10-CM | POA: Diagnosis not present

## 2020-04-05 DIAGNOSIS — D123 Benign neoplasm of transverse colon: Secondary | ICD-10-CM | POA: Diagnosis not present

## 2020-04-23 DIAGNOSIS — E7800 Pure hypercholesterolemia, unspecified: Secondary | ICD-10-CM | POA: Diagnosis not present

## 2020-04-23 DIAGNOSIS — I10 Essential (primary) hypertension: Secondary | ICD-10-CM | POA: Diagnosis not present

## 2020-05-20 DIAGNOSIS — I10 Essential (primary) hypertension: Secondary | ICD-10-CM | POA: Diagnosis not present

## 2020-05-20 DIAGNOSIS — E7800 Pure hypercholesterolemia, unspecified: Secondary | ICD-10-CM | POA: Diagnosis not present

## 2020-05-29 DIAGNOSIS — H35033 Hypertensive retinopathy, bilateral: Secondary | ICD-10-CM | POA: Diagnosis not present

## 2020-05-29 DIAGNOSIS — H35361 Drusen (degenerative) of macula, right eye: Secondary | ICD-10-CM | POA: Diagnosis not present

## 2020-05-29 DIAGNOSIS — H0288B Meibomian gland dysfunction left eye, upper and lower eyelids: Secondary | ICD-10-CM | POA: Diagnosis not present

## 2020-05-29 DIAGNOSIS — H35432 Paving stone degeneration of retina, left eye: Secondary | ICD-10-CM | POA: Diagnosis not present

## 2020-05-29 DIAGNOSIS — H0288A Meibomian gland dysfunction right eye, upper and lower eyelids: Secondary | ICD-10-CM | POA: Diagnosis not present

## 2020-05-29 DIAGNOSIS — H40012 Open angle with borderline findings, low risk, left eye: Secondary | ICD-10-CM | POA: Diagnosis not present

## 2020-05-29 DIAGNOSIS — H43823 Vitreomacular adhesion, bilateral: Secondary | ICD-10-CM | POA: Diagnosis not present

## 2020-05-29 DIAGNOSIS — H524 Presbyopia: Secondary | ICD-10-CM | POA: Diagnosis not present

## 2020-05-29 DIAGNOSIS — H0102B Squamous blepharitis left eye, upper and lower eyelids: Secondary | ICD-10-CM | POA: Diagnosis not present

## 2020-05-29 DIAGNOSIS — H0102A Squamous blepharitis right eye, upper and lower eyelids: Secondary | ICD-10-CM | POA: Diagnosis not present

## 2020-06-10 DIAGNOSIS — L718 Other rosacea: Secondary | ICD-10-CM | POA: Diagnosis not present

## 2020-06-10 DIAGNOSIS — D225 Melanocytic nevi of trunk: Secondary | ICD-10-CM | POA: Diagnosis not present

## 2020-06-10 DIAGNOSIS — L821 Other seborrheic keratosis: Secondary | ICD-10-CM | POA: Diagnosis not present

## 2020-06-10 DIAGNOSIS — L570 Actinic keratosis: Secondary | ICD-10-CM | POA: Diagnosis not present

## 2020-06-17 ENCOUNTER — Encounter (INDEPENDENT_AMBULATORY_CARE_PROVIDER_SITE_OTHER): Payer: PPO | Admitting: Ophthalmology

## 2020-06-17 ENCOUNTER — Other Ambulatory Visit: Payer: Self-pay

## 2020-06-17 DIAGNOSIS — H43813 Vitreous degeneration, bilateral: Secondary | ICD-10-CM

## 2020-06-17 DIAGNOSIS — I10 Essential (primary) hypertension: Secondary | ICD-10-CM | POA: Diagnosis not present

## 2020-06-17 DIAGNOSIS — H35033 Hypertensive retinopathy, bilateral: Secondary | ICD-10-CM

## 2020-06-17 DIAGNOSIS — H353131 Nonexudative age-related macular degeneration, bilateral, early dry stage: Secondary | ICD-10-CM | POA: Diagnosis not present

## 2020-06-17 DIAGNOSIS — H43822 Vitreomacular adhesion, left eye: Secondary | ICD-10-CM

## 2020-07-05 DIAGNOSIS — I700 Atherosclerosis of aorta: Secondary | ICD-10-CM | POA: Diagnosis not present

## 2020-07-05 DIAGNOSIS — Z79899 Other long term (current) drug therapy: Secondary | ICD-10-CM | POA: Diagnosis not present

## 2020-07-05 DIAGNOSIS — I10 Essential (primary) hypertension: Secondary | ICD-10-CM | POA: Diagnosis not present

## 2020-07-05 DIAGNOSIS — K760 Fatty (change of) liver, not elsewhere classified: Secondary | ICD-10-CM | POA: Diagnosis not present

## 2020-07-05 DIAGNOSIS — Z23 Encounter for immunization: Secondary | ICD-10-CM | POA: Diagnosis not present

## 2020-07-05 DIAGNOSIS — E7800 Pure hypercholesterolemia, unspecified: Secondary | ICD-10-CM | POA: Diagnosis not present

## 2020-07-19 DIAGNOSIS — E7800 Pure hypercholesterolemia, unspecified: Secondary | ICD-10-CM | POA: Diagnosis not present

## 2020-07-19 DIAGNOSIS — I10 Essential (primary) hypertension: Secondary | ICD-10-CM | POA: Diagnosis not present

## 2020-08-05 DIAGNOSIS — G4733 Obstructive sleep apnea (adult) (pediatric): Secondary | ICD-10-CM | POA: Diagnosis not present

## 2020-09-02 DIAGNOSIS — I10 Essential (primary) hypertension: Secondary | ICD-10-CM | POA: Diagnosis not present

## 2020-09-02 DIAGNOSIS — E7800 Pure hypercholesterolemia, unspecified: Secondary | ICD-10-CM | POA: Diagnosis not present

## 2020-09-14 DIAGNOSIS — I10 Essential (primary) hypertension: Secondary | ICD-10-CM | POA: Diagnosis not present

## 2020-09-14 DIAGNOSIS — E7800 Pure hypercholesterolemia, unspecified: Secondary | ICD-10-CM | POA: Diagnosis not present

## 2020-09-30 DIAGNOSIS — D485 Neoplasm of uncertain behavior of skin: Secondary | ICD-10-CM | POA: Diagnosis not present

## 2020-09-30 DIAGNOSIS — L738 Other specified follicular disorders: Secondary | ICD-10-CM | POA: Diagnosis not present

## 2020-09-30 DIAGNOSIS — L570 Actinic keratosis: Secondary | ICD-10-CM | POA: Diagnosis not present

## 2020-10-10 DIAGNOSIS — E7800 Pure hypercholesterolemia, unspecified: Secondary | ICD-10-CM | POA: Diagnosis not present

## 2020-10-10 DIAGNOSIS — I10 Essential (primary) hypertension: Secondary | ICD-10-CM | POA: Diagnosis not present

## 2020-10-28 DIAGNOSIS — I872 Venous insufficiency (chronic) (peripheral): Secondary | ICD-10-CM | POA: Diagnosis not present

## 2020-11-26 DIAGNOSIS — J300 Vasomotor rhinitis: Secondary | ICD-10-CM | POA: Diagnosis not present

## 2020-11-26 DIAGNOSIS — R059 Cough, unspecified: Secondary | ICD-10-CM | POA: Diagnosis not present

## 2020-12-20 DIAGNOSIS — E7800 Pure hypercholesterolemia, unspecified: Secondary | ICD-10-CM | POA: Diagnosis not present

## 2020-12-20 DIAGNOSIS — I10 Essential (primary) hypertension: Secondary | ICD-10-CM | POA: Diagnosis not present

## 2021-01-14 DIAGNOSIS — G4733 Obstructive sleep apnea (adult) (pediatric): Secondary | ICD-10-CM | POA: Diagnosis not present

## 2021-01-15 DIAGNOSIS — I10 Essential (primary) hypertension: Secondary | ICD-10-CM | POA: Diagnosis not present

## 2021-01-15 DIAGNOSIS — Z8546 Personal history of malignant neoplasm of prostate: Secondary | ICD-10-CM | POA: Diagnosis not present

## 2021-01-15 DIAGNOSIS — Z86711 Personal history of pulmonary embolism: Secondary | ICD-10-CM | POA: Diagnosis not present

## 2021-01-15 DIAGNOSIS — K760 Fatty (change of) liver, not elsewhere classified: Secondary | ICD-10-CM | POA: Diagnosis not present

## 2021-01-15 DIAGNOSIS — Z0000 Encounter for general adult medical examination without abnormal findings: Secondary | ICD-10-CM | POA: Diagnosis not present

## 2021-01-15 DIAGNOSIS — I700 Atherosclerosis of aorta: Secondary | ICD-10-CM | POA: Diagnosis not present

## 2021-01-15 DIAGNOSIS — E7800 Pure hypercholesterolemia, unspecified: Secondary | ICD-10-CM | POA: Diagnosis not present

## 2021-01-15 DIAGNOSIS — Z79899 Other long term (current) drug therapy: Secondary | ICD-10-CM | POA: Diagnosis not present

## 2021-01-15 DIAGNOSIS — Z1389 Encounter for screening for other disorder: Secondary | ICD-10-CM | POA: Diagnosis not present

## 2021-01-15 DIAGNOSIS — G4733 Obstructive sleep apnea (adult) (pediatric): Secondary | ICD-10-CM | POA: Diagnosis not present

## 2021-01-20 DIAGNOSIS — G4733 Obstructive sleep apnea (adult) (pediatric): Secondary | ICD-10-CM | POA: Diagnosis not present

## 2021-02-24 DIAGNOSIS — Z86718 Personal history of other venous thrombosis and embolism: Secondary | ICD-10-CM | POA: Diagnosis not present

## 2021-03-24 DIAGNOSIS — E7800 Pure hypercholesterolemia, unspecified: Secondary | ICD-10-CM | POA: Diagnosis not present

## 2021-03-24 DIAGNOSIS — I10 Essential (primary) hypertension: Secondary | ICD-10-CM | POA: Diagnosis not present

## 2021-03-27 DIAGNOSIS — Z86711 Personal history of pulmonary embolism: Secondary | ICD-10-CM | POA: Diagnosis not present

## 2021-03-27 DIAGNOSIS — M1712 Unilateral primary osteoarthritis, left knee: Secondary | ICD-10-CM | POA: Diagnosis not present

## 2021-03-27 DIAGNOSIS — M25762 Osteophyte, left knee: Secondary | ICD-10-CM | POA: Diagnosis not present

## 2021-04-02 DIAGNOSIS — G4733 Obstructive sleep apnea (adult) (pediatric): Secondary | ICD-10-CM | POA: Diagnosis not present

## 2021-05-12 DIAGNOSIS — L570 Actinic keratosis: Secondary | ICD-10-CM | POA: Diagnosis not present

## 2021-05-12 DIAGNOSIS — L218 Other seborrheic dermatitis: Secondary | ICD-10-CM | POA: Diagnosis not present

## 2021-05-12 DIAGNOSIS — L718 Other rosacea: Secondary | ICD-10-CM | POA: Diagnosis not present

## 2021-05-12 DIAGNOSIS — L2084 Intrinsic (allergic) eczema: Secondary | ICD-10-CM | POA: Diagnosis not present

## 2021-05-12 DIAGNOSIS — L400 Psoriasis vulgaris: Secondary | ICD-10-CM | POA: Diagnosis not present

## 2021-05-12 DIAGNOSIS — L821 Other seborrheic keratosis: Secondary | ICD-10-CM | POA: Diagnosis not present

## 2021-06-10 DIAGNOSIS — B353 Tinea pedis: Secondary | ICD-10-CM | POA: Diagnosis not present

## 2021-06-10 DIAGNOSIS — D225 Melanocytic nevi of trunk: Secondary | ICD-10-CM | POA: Diagnosis not present

## 2021-06-10 DIAGNOSIS — L218 Other seborrheic dermatitis: Secondary | ICD-10-CM | POA: Diagnosis not present

## 2021-06-10 DIAGNOSIS — L814 Other melanin hyperpigmentation: Secondary | ICD-10-CM | POA: Diagnosis not present

## 2021-06-10 DIAGNOSIS — L810 Postinflammatory hyperpigmentation: Secondary | ICD-10-CM | POA: Diagnosis not present

## 2021-06-10 DIAGNOSIS — L718 Other rosacea: Secondary | ICD-10-CM | POA: Diagnosis not present

## 2021-06-10 DIAGNOSIS — L2089 Other atopic dermatitis: Secondary | ICD-10-CM | POA: Diagnosis not present

## 2021-06-10 DIAGNOSIS — L821 Other seborrheic keratosis: Secondary | ICD-10-CM | POA: Diagnosis not present

## 2021-07-07 DIAGNOSIS — E7800 Pure hypercholesterolemia, unspecified: Secondary | ICD-10-CM | POA: Diagnosis not present

## 2021-07-07 DIAGNOSIS — I10 Essential (primary) hypertension: Secondary | ICD-10-CM | POA: Diagnosis not present

## 2021-07-17 DIAGNOSIS — H43811 Vitreous degeneration, right eye: Secondary | ICD-10-CM | POA: Diagnosis not present

## 2021-07-17 DIAGNOSIS — H43822 Vitreomacular adhesion, left eye: Secondary | ICD-10-CM | POA: Diagnosis not present

## 2021-07-17 DIAGNOSIS — H2513 Age-related nuclear cataract, bilateral: Secondary | ICD-10-CM | POA: Diagnosis not present

## 2021-08-15 DIAGNOSIS — I10 Essential (primary) hypertension: Secondary | ICD-10-CM | POA: Diagnosis not present

## 2021-09-18 DIAGNOSIS — I10 Essential (primary) hypertension: Secondary | ICD-10-CM | POA: Diagnosis not present

## 2021-09-18 DIAGNOSIS — E7800 Pure hypercholesterolemia, unspecified: Secondary | ICD-10-CM | POA: Diagnosis not present

## 2021-11-01 DIAGNOSIS — L089 Local infection of the skin and subcutaneous tissue, unspecified: Secondary | ICD-10-CM | POA: Diagnosis not present

## 2021-11-01 DIAGNOSIS — L551 Sunburn of second degree: Secondary | ICD-10-CM | POA: Diagnosis not present

## 2021-11-01 DIAGNOSIS — L309 Dermatitis, unspecified: Secondary | ICD-10-CM | POA: Diagnosis not present

## 2021-11-04 DIAGNOSIS — L03116 Cellulitis of left lower limb: Secondary | ICD-10-CM | POA: Diagnosis not present

## 2021-11-04 DIAGNOSIS — I10 Essential (primary) hypertension: Secondary | ICD-10-CM | POA: Diagnosis not present

## 2022-01-19 DIAGNOSIS — G4733 Obstructive sleep apnea (adult) (pediatric): Secondary | ICD-10-CM | POA: Diagnosis not present

## 2022-01-19 DIAGNOSIS — I700 Atherosclerosis of aorta: Secondary | ICD-10-CM | POA: Diagnosis not present

## 2022-01-19 DIAGNOSIS — Z86711 Personal history of pulmonary embolism: Secondary | ICD-10-CM | POA: Diagnosis not present

## 2022-01-19 DIAGNOSIS — I10 Essential (primary) hypertension: Secondary | ICD-10-CM | POA: Diagnosis not present

## 2022-01-19 DIAGNOSIS — Z1331 Encounter for screening for depression: Secondary | ICD-10-CM | POA: Diagnosis not present

## 2022-01-19 DIAGNOSIS — Z79899 Other long term (current) drug therapy: Secondary | ICD-10-CM | POA: Diagnosis not present

## 2022-01-19 DIAGNOSIS — R21 Rash and other nonspecific skin eruption: Secondary | ICD-10-CM | POA: Diagnosis not present

## 2022-01-19 DIAGNOSIS — K760 Fatty (change of) liver, not elsewhere classified: Secondary | ICD-10-CM | POA: Diagnosis not present

## 2022-01-19 DIAGNOSIS — E7800 Pure hypercholesterolemia, unspecified: Secondary | ICD-10-CM | POA: Diagnosis not present

## 2022-01-19 DIAGNOSIS — Z0000 Encounter for general adult medical examination without abnormal findings: Secondary | ICD-10-CM | POA: Diagnosis not present

## 2022-02-02 DIAGNOSIS — G4733 Obstructive sleep apnea (adult) (pediatric): Secondary | ICD-10-CM | POA: Diagnosis not present

## 2022-03-05 DIAGNOSIS — G4733 Obstructive sleep apnea (adult) (pediatric): Secondary | ICD-10-CM | POA: Diagnosis not present

## 2022-03-11 DIAGNOSIS — G4733 Obstructive sleep apnea (adult) (pediatric): Secondary | ICD-10-CM | POA: Diagnosis not present

## 2022-03-16 DIAGNOSIS — I10 Essential (primary) hypertension: Secondary | ICD-10-CM | POA: Diagnosis not present

## 2022-03-16 DIAGNOSIS — E7800 Pure hypercholesterolemia, unspecified: Secondary | ICD-10-CM | POA: Diagnosis not present

## 2022-04-01 DIAGNOSIS — R052 Subacute cough: Secondary | ICD-10-CM | POA: Diagnosis not present

## 2022-04-01 DIAGNOSIS — J300 Vasomotor rhinitis: Secondary | ICD-10-CM | POA: Diagnosis not present

## 2022-04-15 DIAGNOSIS — J45998 Other asthma: Secondary | ICD-10-CM | POA: Diagnosis not present

## 2022-05-07 DIAGNOSIS — L280 Lichen simplex chronicus: Secondary | ICD-10-CM | POA: Diagnosis not present

## 2022-05-07 DIAGNOSIS — L718 Other rosacea: Secondary | ICD-10-CM | POA: Diagnosis not present

## 2022-05-07 DIAGNOSIS — L2089 Other atopic dermatitis: Secondary | ICD-10-CM | POA: Diagnosis not present

## 2022-06-11 DIAGNOSIS — L821 Other seborrheic keratosis: Secondary | ICD-10-CM | POA: Diagnosis not present

## 2022-06-11 DIAGNOSIS — L814 Other melanin hyperpigmentation: Secondary | ICD-10-CM | POA: Diagnosis not present

## 2022-06-11 DIAGNOSIS — L570 Actinic keratosis: Secondary | ICD-10-CM | POA: Diagnosis not present

## 2022-06-11 DIAGNOSIS — L280 Lichen simplex chronicus: Secondary | ICD-10-CM | POA: Diagnosis not present

## 2022-06-11 DIAGNOSIS — L718 Other rosacea: Secondary | ICD-10-CM | POA: Diagnosis not present

## 2022-06-11 DIAGNOSIS — D225 Melanocytic nevi of trunk: Secondary | ICD-10-CM | POA: Diagnosis not present

## 2022-09-05 ENCOUNTER — Emergency Department (HOSPITAL_BASED_OUTPATIENT_CLINIC_OR_DEPARTMENT_OTHER): Payer: HMO

## 2022-09-05 ENCOUNTER — Emergency Department (HOSPITAL_BASED_OUTPATIENT_CLINIC_OR_DEPARTMENT_OTHER)
Admission: EM | Admit: 2022-09-05 | Discharge: 2022-09-05 | Disposition: A | Discharge status: Home / Self Care | Payer: HMO | Attending: Emergency Medicine | Admitting: Emergency Medicine

## 2022-09-05 ENCOUNTER — Encounter (HOSPITAL_BASED_OUTPATIENT_CLINIC_OR_DEPARTMENT_OTHER): Payer: Self-pay | Admitting: Emergency Medicine

## 2022-09-05 DIAGNOSIS — Z7901 Long term (current) use of anticoagulants: Secondary | ICD-10-CM | POA: Insufficient documentation

## 2022-09-05 DIAGNOSIS — I10 Essential (primary) hypertension: Secondary | ICD-10-CM | POA: Diagnosis not present

## 2022-09-05 DIAGNOSIS — R079 Chest pain, unspecified: Secondary | ICD-10-CM | POA: Insufficient documentation

## 2022-09-05 LAB — CBC WITH DIFFERENTIAL/PLATELET
Abs Immature Granulocytes: 0.02 10*3/uL (ref 0.00–0.07)
Basophils Absolute: 0 10*3/uL (ref 0.0–0.1)
Basophils Relative: 1 %
Eosinophils Absolute: 0 10*3/uL (ref 0.0–0.5)
Eosinophils Relative: 0 %
HCT: 42.6 % (ref 39.0–52.0)
Hemoglobin: 14.5 g/dL (ref 13.0–17.0)
Immature Granulocytes: 0 %
Lymphocytes Relative: 17 %
Lymphs Abs: 1.3 10*3/uL (ref 0.7–4.0)
MCH: 34.4 pg — ABNORMAL HIGH (ref 26.0–34.0)
MCHC: 34 g/dL (ref 30.0–36.0)
MCV: 101.2 fL — ABNORMAL HIGH (ref 80.0–100.0)
Monocytes Absolute: 1.1 10*3/uL — ABNORMAL HIGH (ref 0.1–1.0)
Monocytes Relative: 15 %
Neutro Abs: 5.2 10*3/uL (ref 1.7–7.7)
Neutrophils Relative %: 67 %
Platelets: 158 10*3/uL (ref 150–400)
RBC: 4.21 MIL/uL — ABNORMAL LOW (ref 4.22–5.81)
RDW: 14.3 % (ref 11.5–15.5)
WBC: 7.8 10*3/uL (ref 4.0–10.5)
nRBC: 0 % (ref 0.0–0.2)

## 2022-09-05 LAB — BASIC METABOLIC PANEL
Anion gap: 13 (ref 5–15)
BUN: 20 mg/dL (ref 8–23)
CO2: 20 mmol/L — ABNORMAL LOW (ref 22–32)
Calcium: 9.6 mg/dL (ref 8.9–10.3)
Chloride: 105 mmol/L (ref 98–111)
Creatinine, Ser: 1.09 mg/dL (ref 0.61–1.24)
GFR, Estimated: >60 mL/min (ref >60)
Glucose, Bld: 111 mg/dL — ABNORMAL HIGH (ref 70–99)
Potassium: 3.7 mmol/L (ref 3.5–5.1)
Sodium: 138 mmol/L (ref 135–145)

## 2022-09-05 LAB — TROPONIN I (HIGH SENSITIVITY)
Troponin I (High Sensitivity): 4 ng/L (ref <18)
Troponin I (High Sensitivity): 6 ng/L (ref <18)

## 2022-09-05 MED ORDER — FAMOTIDINE 20 MG PO TABS
20.0000 mg | ORAL_TABLET | Freq: Once | ORAL | Status: AC
  Administered 2022-09-05: 20 mg via ORAL
  Filled 2022-09-05: qty 1

## 2022-09-05 MED ORDER — LACTATED RINGERS IV BOLUS
1000.0000 mL | Freq: Once | INTRAVENOUS | Status: AC
  Administered 2022-09-05: 1000 mL via INTRAVENOUS

## 2022-09-05 MED ORDER — ASPIRIN 81 MG PO CHEW
324.0000 mg | CHEWABLE_TABLET | Freq: Once | ORAL | Status: AC
  Administered 2022-09-05: 324 mg via ORAL
  Filled 2022-09-05: qty 4

## 2022-09-05 MED ORDER — IOHEXOL 350 MG/ML SOLN
75.0000 mL | Freq: Once | INTRAVENOUS | Status: AC | PRN
  Administered 2022-09-05: 75 mL via INTRAVENOUS

## 2022-09-05 MED ORDER — ACETAMINOPHEN 500 MG PO TABS
1000.0000 mg | ORAL_TABLET | Freq: Once | ORAL | Status: AC
  Administered 2022-09-05: 1000 mg via ORAL
  Filled 2022-09-05: qty 2

## 2022-09-05 NOTE — ED Triage Notes (Signed)
Upper Chest pain started in Am, sob. Seen at clinic sent for eval Worse with movement History of PE

## 2022-09-05 NOTE — Discharge Instructions (Addendum)
Please take Tylenol 500 mg 4 times a day as needed and Pepcid 20 mg twice daily as needed

## 2022-09-05 NOTE — ED Provider Notes (Signed)
[facility] Provider Note   CSN: [id number] Arrival date & time: 09/05/22  1602     History Chief Complaint  Patient presents with   Chest Pain    HPI Kenneth Lang is a 72 y.o. male presenting for chief complaint of episode of chest pain.  He is a 72 year old male history of hypertension, hypercholesterolemia, PE consistent with his Eliquis presenting with a chief complaint of chest discomfort earlier today.  Now improving.  He denies fevers chills nausea vomiting syncope shortness of breath.  Otherwise ambulatory tolerating p.o. intake at this time..   Patient's recorded medical, surgical, social, medication list and allergies were reviewed in the Snapshot window as part of the initial history.   Review of Systems   Review of Systems  Constitutional:  Negative for chills and fever.  HENT:  Negative for ear pain and sore throat.   Eyes:  Negative for pain and visual disturbance.  Respiratory:  Negative for cough and shortness of breath.   Cardiovascular:  Positive for chest pain. Negative for palpitations.  Gastrointestinal:  Negative for abdominal pain and vomiting.  Genitourinary:  Negative for dysuria and hematuria.  Musculoskeletal:  Negative for arthralgias and back pain.  Skin:  Negative for color change and rash.  Neurological:  Negative for seizures and syncope.  All other systems reviewed and are negative.   Physical Exam Updated Vital Signs BP (!) 144/90   Pulse (!) 106   Temp 99.1 F (37.3 C) (Oral)   Resp 17   SpO2 94%  Physical Exam Vitals and nursing note reviewed.  Constitutional:      General: He is not in acute distress.    Appearance: He is well-developed.  HENT:     Head: Normocephalic and atraumatic.  Eyes:     Conjunctiva/sclera: Conjunctivae normal.  Cardiovascular:     Rate and Rhythm: Normal rate and regular rhythm.     Heart sounds: No murmur heard. Pulmonary:     Effort: Pulmonary effort  is normal. No respiratory distress.     Breath sounds: Normal breath sounds.  Abdominal:     Palpations: Abdomen is soft.     Tenderness: There is no abdominal tenderness.  Musculoskeletal:        General: No swelling.     Cervical back: Neck supple.  Skin:    General: Skin is warm and dry.     Capillary Refill: Capillary refill takes less than 2 seconds.  Neurological:     Mental Status: He is alert.  Psychiatric:        Mood and Affect: Mood normal.      ED Course/ Medical Decision Making/ A&P    Procedures Procedures   Medications Ordered in ED Medications  lactated ringers bolus 1,000 mL (0 mLs Intravenous Stopped 09/05/22 1903)  acetaminophen (TYLENOL) tablet 1,000 mg (1,000 mg Oral Given 09/05/22 1634)  famotidine (PEPCID) tablet 20 mg (20 mg Oral Given 09/05/22 1634)  aspirin chewable tablet 324 mg (324 mg Oral Given 09/05/22 1634)  lactated ringers bolus 1,000 mL (1,000 mLs Intravenous New Bag/Given 09/05/22 1930)  iohexol (OMNIPAQUE) 350 MG/ML injection 75 mL (75 mLs Intravenous Contrast Given 09/05/22 1939)   Medical Decision Making: Kenneth Lang is a 72 y.o. male who presented to the ED today with chest pain, detailed above.  Based on patient's comorbidities, patient has a heart score of 3.    Patient's presentation is complicated by their history of multiple comorbid medical  problems.  Patient placed on continuous vitals and telemetry monitoring while in ED which was reviewed periodically.  Complete initial physical exam performed, notably the patient was hemodynamically stable in no acute distress.   Reviewed and confirmed nursing documentation for past medical history, family history, social history.    Initial Assessment: With the patient's presentation of left-sided chest pain, most likely diagnosis is musculoskeletal chest pain versus GERD, although ACS remains on the differential. Other diagnoses were considered including (but not limited to) pulmonary  embolism, community-acquired pneumonia, aortic dissection, pneumothorax, underlying bony abnormality, anemia. These are considered less likely due to history of present illness and physical exam findings.    In particular, concerning pulmonary embolism: They have a history of DVT and have chronic leg swelling leading to a moderate risk Wells score. Aortic Dissection also reconsidered but seems less likely based on the location, quality, onset, and severity of symptoms in this case. Patient has a lack of serious comorbidities for this condition including a lack of Smoking. Patient also has a lack of underlying history of AD or TAA.  This is most consistent with an acute life/limb threatening illness complicated by underlying chronic conditions.   Initial Plan: Evaluate for ACS with delta troponin and EKG evaluated as below  Evaluate for dissection, bony abnormality, or pneumonia with chest x-ray and screening laboratory evaluation including CBC, BMP  Further evaluation for pulmonary embolism indicated at this time based on patient's PERC and Wells score.  Will proceed with CT angiography of the chest Further evaluation for Thoracic Aortic Dissection not indicated at this time based on patient's clinical history and PE findings.   Initial Study Results: EKG was reviewed independently. Rate, rhythm, axis, intervals all examined and without medically relevant abnormality. ST segments without concerns for elevations.    Laboratory  Delta troponin demonstrated no acute abnormality  CBC and BMP without obvious metabolic or inflammatory abnormalities requiring further evaluation   Radiology  CT Angio Chest PE W and/or Wo Contrast  Result Date: 09/05/2022 CLINICAL DATA:  Acute onset upper chest pain and shortness of breath EXAM: CT ANGIOGRAPHY CHEST WITH CONTRAST TECHNIQUE: Multidetector CT imaging of the chest was performed using the standard protocol during bolus administration of intravenous  contrast. Multiplanar CT image reconstructions and MIPs were obtained to evaluate the vascular anatomy. RADIATION DOSE REDUCTION: This exam was performed according to the departmental dose-optimization program which includes automated exposure control, adjustment of the mA and/or kV according to patient size and/or use of iterative reconstruction technique. CONTRAST:  51mL OMNIPAQUE IOHEXOL 350 MG/ML SOLN COMPARISON:  CTA chest dated 02/12/2019 FINDINGS: Cardiovascular: The study is adequate quality for the evaluation of pulmonary embolism. There are no filling defects in the central, lobar, segmental or subsegmental pulmonary artery branches to suggest acute pulmonary embolism. Great vessels are normal in course and caliber. Normal heart size. No significant pericardial fluid/thickening. Coronary artery calcifications and aortic atherosclerosis. Mediastinum/Nodes: Imaged thyroid gland without nodules meeting criteria for imaging follow-up by size. Normal esophagus. No pathologically enlarged axillary, supraclavicular, mediastinal, or hilar lymph nodes. Lungs/Pleura: The central airways are patent. Mild diffuse bronchial wall thickening with bilateral lower lobe traction bronchiectasis. Bilateral lower lobe and lingular subsegmental atelectasis. No pneumothorax. No pleural effusion. Upper abdomen: Normal. Musculoskeletal: No acute or abnormal lytic or blastic osseous lesions. Flowing anterior osteophytes over at least 4 contiguous thoracic vertebrae which can be seen in the setting of diffuse idiopathic skeletal hyperostosis (DISH). Review of the MIP images confirms the above findings. IMPRESSION: 1.  No acute pulmonary embolism. 2. Mild diffuse bronchial wall thickening with bilateral lower lobe traction bronchiectasis, suggestive of chronic bronchitis. 3. Bilateral lower lobe and lingular subsegmental atelectasis. 4. Aortic Atherosclerosis (ICD10-I70.0). Coronary artery calcifications. Assessment for potential risk  factor modification, dietary therapy or pharmacologic therapy may be warranted, if clinically indicated. Electronically Signed   By: Darrin Nipper M.D.   On: 09/05/2022 20:06   DG Chest Portable 1 View  Result Date: 09/05/2022 CLINICAL DATA:  Chest pain today. EXAM: PORTABLE CHEST 1 VIEW COMPARISON:  02/12/2019 FINDINGS: The heart is normal in size. Stable mediastinal contours. There is subsegmental opacities at both lung bases. No confluent consolidation. No pleural fluid. No pneumothorax. No acute osseous findings. IMPRESSION: Subsegmental bibasilar atelectasis. Electronically Signed   By: Keith Rake M.D.   On: 09/05/2022 16:26    Final Assessment and Plan: On reassessment, symptoms are grossly improved.  Chest pain is continuing to be mild and approaching resolution at this time.  He is ambulatory tolerating p.o. intake in no acute distress.  Vital signs all within normal limits.  Has slight tachycardia which has been present during prior appointments patient states. Given overall well appearance, I do not believe there is any indication for further aggressive intervention in the emergency room.  Grossly reassuring physical exam at this time with no new pathology detected on CT angiography or serial lab studies. Recommend patient follow-up with cardiology per referral from his PCP and follow-up with PCP on Monday or Tuesday for reassessment of his symptoms. He does have substantial lower extremity swelling which I am attributing to his chronic DVTs though new diagnosis heart failure would also be a potential diagnosis. Regardless he is in no acute distress this time and no further indication for intervention in ER at this time Disposition:  I have considered need for hospitalization, however, considering all of the above, I believe this patient is stable for discharge at this time.  Patient/family educated about specific return precautions for given chief complaint and symptoms.  Patient/family  educated about follow-up with PCP and Cardiology.     Patient/family expressed understanding of return precautions and need for follow-up. Patient spoken to regarding all imaging and laboratory results and appropriate follow up for these results. All education provided in verbal form with additional information in written form. Time was allowed for answering of patient questions. Patient discharged.    Emergency Department Medication Summary:   Medications  lactated ringers bolus 1,000 mL (0 mLs Intravenous Stopped 09/05/22 1903)  acetaminophen (TYLENOL) tablet 1,000 mg (1,000 mg Oral Given 09/05/22 1634)  famotidine (PEPCID) tablet 20 mg (20 mg Oral Given 09/05/22 1634)  aspirin chewable tablet 324 mg (324 mg Oral Given 09/05/22 1634)  lactated ringers bolus 1,000 mL (1,000 mLs Intravenous New Bag/Given 09/05/22 1930)  iohexol (OMNIPAQUE) 350 MG/ML injection 75 mL (75 mLs Intravenous Contrast Given 09/05/22 1939)        Clinical Impression:  1. Chest pain, unspecified type      Data Unavailable   Final Clinical Impression(s) / ED Diagnoses Final diagnoses:  Chest pain, unspecified type    Rx / DC Orders ED Discharge Orders     None         Tretha Sciara, MD 09/05/22 2035

## 2023-08-20 DIAGNOSIS — H52203 Unspecified astigmatism, bilateral: Secondary | ICD-10-CM | POA: Diagnosis not present

## 2023-08-20 DIAGNOSIS — H2513 Age-related nuclear cataract, bilateral: Secondary | ICD-10-CM | POA: Diagnosis not present

## 2023-08-23 DIAGNOSIS — H43813 Vitreous degeneration, bilateral: Secondary | ICD-10-CM | POA: Diagnosis not present

## 2023-08-23 DIAGNOSIS — H43393 Other vitreous opacities, bilateral: Secondary | ICD-10-CM | POA: Diagnosis not present

## 2023-08-23 DIAGNOSIS — H04123 Dry eye syndrome of bilateral lacrimal glands: Secondary | ICD-10-CM | POA: Diagnosis not present

## 2023-08-23 DIAGNOSIS — H53143 Visual discomfort, bilateral: Secondary | ICD-10-CM | POA: Diagnosis not present

## 2023-09-23 DIAGNOSIS — M47816 Spondylosis without myelopathy or radiculopathy, lumbar region: Secondary | ICD-10-CM | POA: Diagnosis not present

## 2023-09-23 DIAGNOSIS — M62830 Muscle spasm of back: Secondary | ICD-10-CM | POA: Diagnosis not present

## 2023-09-23 DIAGNOSIS — M9902 Segmental and somatic dysfunction of thoracic region: Secondary | ICD-10-CM | POA: Diagnosis not present

## 2023-09-23 DIAGNOSIS — M9903 Segmental and somatic dysfunction of lumbar region: Secondary | ICD-10-CM | POA: Diagnosis not present

## 2023-09-24 DIAGNOSIS — M9902 Segmental and somatic dysfunction of thoracic region: Secondary | ICD-10-CM | POA: Diagnosis not present

## 2023-09-24 DIAGNOSIS — M47816 Spondylosis without myelopathy or radiculopathy, lumbar region: Secondary | ICD-10-CM | POA: Diagnosis not present

## 2023-09-24 DIAGNOSIS — M9903 Segmental and somatic dysfunction of lumbar region: Secondary | ICD-10-CM | POA: Diagnosis not present

## 2023-09-24 DIAGNOSIS — M62830 Muscle spasm of back: Secondary | ICD-10-CM | POA: Diagnosis not present

## 2023-09-27 DIAGNOSIS — M62830 Muscle spasm of back: Secondary | ICD-10-CM | POA: Diagnosis not present

## 2023-09-27 DIAGNOSIS — M9903 Segmental and somatic dysfunction of lumbar region: Secondary | ICD-10-CM | POA: Diagnosis not present

## 2023-09-27 DIAGNOSIS — M47816 Spondylosis without myelopathy or radiculopathy, lumbar region: Secondary | ICD-10-CM | POA: Diagnosis not present

## 2023-09-27 DIAGNOSIS — M9902 Segmental and somatic dysfunction of thoracic region: Secondary | ICD-10-CM | POA: Diagnosis not present

## 2023-10-04 DIAGNOSIS — M9902 Segmental and somatic dysfunction of thoracic region: Secondary | ICD-10-CM | POA: Diagnosis not present

## 2023-10-04 DIAGNOSIS — M9903 Segmental and somatic dysfunction of lumbar region: Secondary | ICD-10-CM | POA: Diagnosis not present

## 2023-10-04 DIAGNOSIS — M47816 Spondylosis without myelopathy or radiculopathy, lumbar region: Secondary | ICD-10-CM | POA: Diagnosis not present

## 2023-10-04 DIAGNOSIS — M62830 Muscle spasm of back: Secondary | ICD-10-CM | POA: Diagnosis not present

## 2023-10-11 DIAGNOSIS — M47816 Spondylosis without myelopathy or radiculopathy, lumbar region: Secondary | ICD-10-CM | POA: Diagnosis not present

## 2023-10-11 DIAGNOSIS — M62830 Muscle spasm of back: Secondary | ICD-10-CM | POA: Diagnosis not present

## 2023-10-11 DIAGNOSIS — M9902 Segmental and somatic dysfunction of thoracic region: Secondary | ICD-10-CM | POA: Diagnosis not present

## 2023-10-11 DIAGNOSIS — M9903 Segmental and somatic dysfunction of lumbar region: Secondary | ICD-10-CM | POA: Diagnosis not present

## 2023-10-18 DIAGNOSIS — M9903 Segmental and somatic dysfunction of lumbar region: Secondary | ICD-10-CM | POA: Diagnosis not present

## 2023-10-18 DIAGNOSIS — M47816 Spondylosis without myelopathy or radiculopathy, lumbar region: Secondary | ICD-10-CM | POA: Diagnosis not present

## 2023-10-18 DIAGNOSIS — M62830 Muscle spasm of back: Secondary | ICD-10-CM | POA: Diagnosis not present

## 2023-10-18 DIAGNOSIS — M9902 Segmental and somatic dysfunction of thoracic region: Secondary | ICD-10-CM | POA: Diagnosis not present

## 2023-11-18 DIAGNOSIS — G4733 Obstructive sleep apnea (adult) (pediatric): Secondary | ICD-10-CM | POA: Diagnosis not present

## 2024-01-01 DIAGNOSIS — L259 Unspecified contact dermatitis, unspecified cause: Secondary | ICD-10-CM | POA: Diagnosis not present

## 2024-02-08 DIAGNOSIS — G4733 Obstructive sleep apnea (adult) (pediatric): Secondary | ICD-10-CM | POA: Diagnosis not present

## 2024-02-28 DIAGNOSIS — M1A0710 Idiopathic chronic gout, right ankle and foot, without tophus (tophi): Secondary | ICD-10-CM | POA: Diagnosis not present

## 2024-02-28 DIAGNOSIS — E7800 Pure hypercholesterolemia, unspecified: Secondary | ICD-10-CM | POA: Diagnosis not present

## 2024-02-28 DIAGNOSIS — Z8546 Personal history of malignant neoplasm of prostate: Secondary | ICD-10-CM | POA: Diagnosis not present

## 2024-02-28 DIAGNOSIS — I10 Essential (primary) hypertension: Secondary | ICD-10-CM | POA: Diagnosis not present

## 2024-02-28 DIAGNOSIS — K760 Fatty (change of) liver, not elsewhere classified: Secondary | ICD-10-CM | POA: Diagnosis not present

## 2024-02-28 DIAGNOSIS — Z86711 Personal history of pulmonary embolism: Secondary | ICD-10-CM | POA: Diagnosis not present

## 2024-02-28 DIAGNOSIS — Z7901 Long term (current) use of anticoagulants: Secondary | ICD-10-CM | POA: Diagnosis not present

## 2024-03-30 DIAGNOSIS — M1A0710 Idiopathic chronic gout, right ankle and foot, without tophus (tophi): Secondary | ICD-10-CM | POA: Diagnosis not present

## 2024-03-30 DIAGNOSIS — I10 Essential (primary) hypertension: Secondary | ICD-10-CM | POA: Diagnosis not present

## 2024-03-30 DIAGNOSIS — Z0000 Encounter for general adult medical examination without abnormal findings: Secondary | ICD-10-CM | POA: Diagnosis not present

## 2024-03-30 DIAGNOSIS — E7800 Pure hypercholesterolemia, unspecified: Secondary | ICD-10-CM | POA: Diagnosis not present

## 2024-03-30 DIAGNOSIS — Z1331 Encounter for screening for depression: Secondary | ICD-10-CM | POA: Diagnosis not present

## 2024-03-30 DIAGNOSIS — Z23 Encounter for immunization: Secondary | ICD-10-CM | POA: Diagnosis not present

## 2024-03-30 DIAGNOSIS — Z8546 Personal history of malignant neoplasm of prostate: Secondary | ICD-10-CM | POA: Diagnosis not present

## 2024-04-13 DIAGNOSIS — Z23 Encounter for immunization: Secondary | ICD-10-CM | POA: Diagnosis not present

## 2024-04-14 DIAGNOSIS — J300 Vasomotor rhinitis: Secondary | ICD-10-CM | POA: Diagnosis not present

## 2024-04-14 DIAGNOSIS — J4520 Mild intermittent asthma, uncomplicated: Secondary | ICD-10-CM | POA: Diagnosis not present
